# Patient Record
Sex: Male | Born: 1990
Health system: Southern US, Community
[De-identification: ages and names within clinical notes are randomized; demographics above are authoritative.]

## PROBLEM LIST (undated history)

## (undated) DIAGNOSIS — Z9481 Bone marrow transplant status: Secondary | ICD-10-CM

## (undated) DIAGNOSIS — F410 Panic disorder [episodic paroxysmal anxiety] without agoraphobia: Secondary | ICD-10-CM

## (undated) DIAGNOSIS — C4490 Unspecified malignant neoplasm of skin, unspecified: Secondary | ICD-10-CM

## (undated) DIAGNOSIS — H5789 Other specified disorders of eye and adnexa: Secondary | ICD-10-CM

## (undated) DIAGNOSIS — C7490 Malignant neoplasm of unspecified part of unspecified adrenal gland: Secondary | ICD-10-CM

## (undated) DIAGNOSIS — C50919 Malignant neoplasm of unspecified site of unspecified female breast: Secondary | ICD-10-CM

## (undated) HISTORY — PX: ADRENALECTOMY: SHX876

## (undated) HISTORY — PX: MASTECTOMY: SHX3

---
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
## 2010-02-07 ENCOUNTER — Ambulatory Visit: Payer: Self-pay | Admitting: Diagnostic Radiology

## 2010-02-07 ENCOUNTER — Emergency Department (HOSPITAL_BASED_OUTPATIENT_CLINIC_OR_DEPARTMENT_OTHER): Admission: EM | Admit: 2010-02-07 | Discharge: 2010-02-07 | Payer: Self-pay | Admitting: Emergency Medicine

## 2010-02-09 ENCOUNTER — Emergency Department (HOSPITAL_BASED_OUTPATIENT_CLINIC_OR_DEPARTMENT_OTHER): Admission: EM | Admit: 2010-02-09 | Discharge: 2010-02-09 | Payer: Self-pay | Admitting: Emergency Medicine

## 2010-02-09 ENCOUNTER — Ambulatory Visit: Payer: Self-pay | Admitting: Diagnostic Radiology

## 2010-03-30 ENCOUNTER — Emergency Department (HOSPITAL_COMMUNITY): Admission: EM | Admit: 2010-03-30 | Discharge: 2010-03-30 | Payer: Self-pay | Admitting: Emergency Medicine

## 2010-08-10 LAB — COMPREHENSIVE METABOLIC PANEL
ALT: 13 U/L (ref 0–53)
AST: 18 U/L (ref 0–37)
Albumin: 4.5 g/dL (ref 3.5–5.2)
Alkaline Phosphatase: 62 U/L (ref 39–117)
BUN: 13 mg/dL (ref 6–23)
CO2: 29 mEq/L (ref 19–32)
Calcium: 10.1 mg/dL (ref 8.4–10.5)
Chloride: 105 mEq/L (ref 96–112)
Creatinine, Ser: 1 mg/dL (ref 0.4–1.5)
GFR calc Af Amer: >60 mL/min (ref >60)
GFR calc non Af Amer: >60 mL/min (ref >60)
Glucose, Bld: 97 mg/dL (ref 70–99)
Potassium: 3.9 mEq/L (ref 3.5–5.1)
Sodium: 146 mEq/L — ABNORMAL HIGH (ref 135–145)
Total Bilirubin: 1.1 mg/dL (ref 0.3–1.2)
Total Protein: 8.1 g/dL (ref 6.0–8.3)

## 2010-08-10 LAB — CBC
HCT: 39.3 % (ref 39.0–52.0)
Hemoglobin: 13.7 g/dL (ref 13.0–17.0)
MCH: 31.7 pg (ref 26.0–34.0)
MCH: 31.5 pg (ref 26.0–34.0)
MCHC: 34.9 g/dL (ref 30.0–36.0)
MCV: 90.8 fL (ref 78.0–100.0)
MCV: 90.8 fL (ref 78.0–100.0)
Platelets: 202 10*3/uL (ref 150–400)
Platelets: 214 10*3/uL (ref 150–400)
RBC: 4.33 MIL/uL (ref 4.22–5.81)
RBC: 4.52 MIL/uL (ref 4.22–5.81)
RDW: 12.9 % (ref 11.5–15.5)
RDW: 12.4 % (ref 11.5–15.5)
WBC: 5.4 10*3/uL (ref 4.0–10.5)
WBC: 6 10*3/uL (ref 4.0–10.5)

## 2010-08-10 LAB — POCT CARDIAC MARKERS
Myoglobin, poc: 33.2 ng/mL (ref 12–200)
Troponin i, poc: <0.05 ng/mL (ref 0.00–0.09)

## 2010-08-10 LAB — BASIC METABOLIC PANEL
BUN: 9 mg/dL (ref 6–23)
Calcium: 9.8 mg/dL (ref 8.4–10.5)
Chloride: 101 mEq/L (ref 96–112)
Creatinine, Ser: 1 mg/dL (ref 0.4–1.5)
GFR calc Af Amer: >60 mL/min (ref >60)

## 2010-08-10 LAB — DIFFERENTIAL
Basophils Absolute: 0 10*3/uL (ref 0.0–0.1)
Basophils Absolute: 0 10*3/uL (ref 0.0–0.1)
Basophils Relative: 1 % (ref 0–1)
Eosinophils Relative: 1 % (ref 0–5)
Eosinophils Relative: 2 % (ref 0–5)
Lymphocytes Relative: 19 % (ref 12–46)
Lymphs Abs: 1 10*3/uL (ref 0.7–4.0)
Monocytes Absolute: 0.5 10*3/uL (ref 0.1–1.0)
Neutro Abs: 3.9 10*3/uL (ref 1.7–7.7)
Neutrophils Relative %: 71 % (ref 43–77)

## 2010-08-10 LAB — URINALYSIS, ROUTINE W REFLEX MICROSCOPIC
Bilirubin Urine: NEGATIVE
Hgb urine dipstick: NEGATIVE
Ketones, ur: NEGATIVE mg/dL
Nitrite: NEGATIVE
Protein, ur: NEGATIVE mg/dL
Urobilinogen, UA: 1 mg/dL (ref 0.0–1.0)

## 2010-08-10 LAB — POCT TOXICOLOGY PANEL

## 2010-08-10 LAB — TSH: TSH: 1.944 u[IU]/mL (ref 0.350–4.500)

## 2010-08-10 LAB — CORTISOL: Cortisol, Plasma: 20.2 ug/dL

## 2012-09-25 ENCOUNTER — Encounter (HOSPITAL_BASED_OUTPATIENT_CLINIC_OR_DEPARTMENT_OTHER): Payer: Self-pay | Admitting: Emergency Medicine

## 2012-09-25 ENCOUNTER — Emergency Department (HOSPITAL_BASED_OUTPATIENT_CLINIC_OR_DEPARTMENT_OTHER)
Admission: EM | Admit: 2012-09-25 | Discharge: 2012-09-25 | Disposition: A | Discharge status: Home / Self Care | Payer: BC Managed Care – PPO | Attending: Emergency Medicine | Admitting: Emergency Medicine

## 2012-09-25 DIAGNOSIS — R1012 Left upper quadrant pain: Secondary | ICD-10-CM | POA: Insufficient documentation

## 2012-09-25 DIAGNOSIS — R112 Nausea with vomiting, unspecified: Secondary | ICD-10-CM | POA: Insufficient documentation

## 2012-09-25 DIAGNOSIS — Z85858 Personal history of malignant neoplasm of other endocrine glands: Secondary | ICD-10-CM | POA: Insufficient documentation

## 2012-09-25 DIAGNOSIS — R42 Dizziness and giddiness: Secondary | ICD-10-CM | POA: Insufficient documentation

## 2012-09-25 DIAGNOSIS — Z79899 Other long term (current) drug therapy: Secondary | ICD-10-CM | POA: Insufficient documentation

## 2012-09-25 HISTORY — DX: Bone marrow transplant status: Z94.81

## 2012-09-25 HISTORY — DX: Malignant neoplasm of unspecified part of unspecified adrenal gland: C74.90

## 2012-09-25 LAB — COMPREHENSIVE METABOLIC PANEL
AST: 19 U/L (ref 0–37)
Albumin: 4.4 g/dL (ref 3.5–5.2)
CO2: 29 mEq/L (ref 19–32)
Calcium: 9.7 mg/dL (ref 8.4–10.5)
Creatinine, Ser: 1 mg/dL (ref 0.50–1.35)
GFR calc non Af Amer: >90 mL/min (ref >90)
Total Protein: 7.6 g/dL (ref 6.0–8.3)

## 2012-09-25 LAB — CBC WITH DIFFERENTIAL/PLATELET
Eosinophils Absolute: 0 10*3/uL (ref 0.0–0.7)
HCT: 38.5 % — ABNORMAL LOW (ref 39.0–52.0)
Hemoglobin: 13.8 g/dL (ref 13.0–17.0)
Lymphs Abs: 0.7 10*3/uL (ref 0.7–4.0)
MCH: 30.7 pg (ref 26.0–34.0)
Monocytes Relative: 12 % (ref 3–12)
Neutrophils Relative %: 79 % — ABNORMAL HIGH (ref 43–77)
RBC: 4.5 MIL/uL (ref 4.22–5.81)

## 2012-09-25 LAB — LIPASE, BLOOD: Lipase: 13 U/L (ref 11–59)

## 2012-09-25 MED ORDER — SODIUM CHLORIDE 0.9 % IV BOLUS (SEPSIS)
1000.0000 mL | Freq: Once | INTRAVENOUS | Status: AC
  Administered 2012-09-25: 1000 mL via INTRAVENOUS

## 2012-09-25 MED ORDER — ONDANSETRON HCL 4 MG/2ML IJ SOLN
4.0000 mg | Freq: Once | INTRAMUSCULAR | Status: AC
  Administered 2012-09-25: 4 mg via INTRAVENOUS
  Filled 2012-09-25: qty 2

## 2012-09-25 MED ORDER — ONDANSETRON 8 MG PO TBDP: 8.0000 mg | ORAL_TABLET | Freq: Three times a day (TID) | ORAL | Status: DC | PRN

## 2012-09-25 MED ORDER — PANTOPRAZOLE SODIUM 40 MG IV SOLR
40.0000 mg | Freq: Once | INTRAVENOUS | Status: AC
  Administered 2012-09-25: 40 mg via INTRAVENOUS
  Filled 2012-09-25: qty 40

## 2012-09-25 NOTE — ED Notes (Signed)
C/o LUQ burning/aching pain with vomiting since 09/24/12.  No diarrhea or fever.

## 2012-09-25 NOTE — ED Provider Notes (Signed)
History     CSN: [id number]  Arrival date & time 09/25/12  8295   First MD Initiated Contact with Patient 09/25/12 0406      Chief Complaint  Patient presents with  . Vomiting and Abdominal Pain     (Consider location/radiation/quality/duration/timing/severity/associated sxs/prior treatment) HPI This is a 22 year old male who developed nausea, vomiting and left upper quadrant burning abdominal pain yesterday afternoon about 2 PM. The symptoms were moderate and improved towards the afternoon. He awoke about 1 AM with a return of symptoms. They were more intense this time but are now easing off. He denies fever, chills or diarrhea. There is little change in discomfort with movement. He attributes the symptoms to eating some "bad crab" yesterday. He states he is lightheaded when he stands.  Past Medical History  Diagnosis Date  . Neuroblastoma of adrenal gland     Past Surgical History  Procedure Laterality Date  . Adrenalectomy      No family history on file.  History  Substance Use Topics  . Smoking status: Never Smoker   . Smokeless tobacco: Not on file  . Alcohol Use: No      Review of Systems  All other systems reviewed and are negative.    Allergies  Review of patient's allergies indicates no known allergies.  Home Medications   Current Outpatient Rx  Name  Route  Sig  Dispense  Refill  . diphenhydrAMINE (BENADRYL) 25 MG tablet   Oral   Take 25 mg by mouth 2 (two) times daily.           BP 119/77  Pulse 85  Temp(Src) 98.3 F (36.8 C)  Ht 5\' 7"  (1.702 m)  Wt 125 lb (56.7 kg)  BMI 19.57 kg/m2  SpO2 97%  Physical Exam General: Well-developed, well-nourished male in no acute distress; appearance consistent with age of record HENT: normocephalic, atraumatic Eyes: pupils equal round and reactive to light; extraocular muscles intact Neck: supple Heart: regular rate and rhythm Lungs: clear to auscultation bilaterally Abdomen: soft; nondistended;  mild epigastric and right upper quadrant tenderness; no masses or hepatosplenomegaly; bowel sounds present Extremities: No deformity; full range of motion Neurologic: Awake, alert and oriented; motor function intact in all extremities and symmetric; no facial droop Skin: Warm and dry Psychiatric: Normal mood and affect    ED Course  Procedures (including critical care time)     MDM   Nursing notes and vitals signs, including pulse oximetry, reviewed.  Summary of this visit's results, reviewed by myself:  Labs:  Results for orders placed during the hospital encounter of 09/25/12 (from the past 24 hour(s))  LIPASE, BLOOD     Status: None   Collection Time    09/25/12  4:23 AM      Result Value Range   Lipase 13  11 - 59 U/L  COMPREHENSIVE METABOLIC PANEL     Status: Abnormal   Collection Time    09/25/12  4:23 AM      Result Value Range   Sodium 142  135 - 145 mEq/L   Potassium 3.4 (*) 3.5 - 5.1 mEq/L   Chloride 102  96 - 112 mEq/L   CO2 29  19 - 32 mEq/L   Glucose, Bld 115 (*) 70 - 99 mg/dL   BUN 8  6 - 23 mg/dL   Creatinine, Ser 6.21  0.50 - 1.35 mg/dL   Calcium 9.7  8.4 - 30.8 mg/dL   Total Protein 7.6  6.0 - 8.3  g/dL   Albumin 4.4  3.5 - 5.2 g/dL   AST 19  0 - 37 U/L   ALT 13  0 - 53 U/L   Alkaline Phosphatase 56  39 - 117 U/L   Total Bilirubin 0.5  0.3 - 1.2 mg/dL   GFR calc non Af Amer >90  >90 mL/min   GFR calc Af Amer >90  >90 mL/min  CBC WITH DIFFERENTIAL     Status: Abnormal   Collection Time    09/25/12  4:23 AM      Result Value Range   WBC 7.2  4.0 - 10.5 K/uL   RBC 4.50  4.22 - 5.81 MIL/uL   Hemoglobin 13.8  13.0 - 17.0 g/dL   HCT 40.9 (*) 81.1 - 91.4 %   MCV 85.6  78.0 - 100.0 fL   MCH 30.7  26.0 - 34.0 pg   MCHC 35.8  30.0 - 36.0 g/dL   RDW 78.2  95.6 - 21.3 %   Platelets 218  150 - 400 K/uL   Neutrophils Relative 79 (*) 43 - 77 %   Neutro Abs 5.6  1.7 - 7.7 K/uL   Lymphocytes Relative 9 (*) 12 - 46 %   Lymphs Abs 0.7  0.7 - 4.0 K/uL    Monocytes Relative 12  3 - 12 %   Monocytes Absolute 0.8  0.1 - 1.0 K/uL   Eosinophils Relative 0  0 - 5 %   Eosinophils Absolute 0.0  0.0 - 0.7 K/uL   Basophils Relative 0  0 - 1 %   Basophils Absolute 0.0  0.0 - 0.1 K/uL   5:08 AM Feels much improved after 1 L normal saline bolus and Zofran IV.        Hanley Seamen, MD 09/25/12 [phone]

## 2012-09-25 NOTE — ED Notes (Addendum)
No meds taken PTA 

## 2013-12-02 ENCOUNTER — Emergency Department (HOSPITAL_BASED_OUTPATIENT_CLINIC_OR_DEPARTMENT_OTHER)
Admission: EM | Admit: 2013-12-02 | Discharge: 2013-12-02 | Disposition: A | Discharge status: Home / Self Care | Payer: BC Managed Care – PPO | Attending: Emergency Medicine | Admitting: Emergency Medicine

## 2013-12-02 ENCOUNTER — Encounter (HOSPITAL_BASED_OUTPATIENT_CLINIC_OR_DEPARTMENT_OTHER): Payer: Self-pay | Admitting: Emergency Medicine

## 2013-12-02 DIAGNOSIS — N6452 Nipple discharge: Secondary | ICD-10-CM

## 2013-12-02 DIAGNOSIS — N63 Unspecified lump in unspecified breast: Secondary | ICD-10-CM

## 2013-12-02 DIAGNOSIS — Z79899 Other long term (current) drug therapy: Secondary | ICD-10-CM | POA: Insufficient documentation

## 2013-12-02 DIAGNOSIS — Z8589 Personal history of malignant neoplasm of other organs and systems: Secondary | ICD-10-CM | POA: Insufficient documentation

## 2013-12-02 NOTE — ED Notes (Signed)
Also reports increased anxiety from worrying.denies SI/HI

## 2013-12-02 NOTE — ED Provider Notes (Signed)
CSN: [id number]     Arrival date & time 12/02/13  1306 History   First MD Initiated Contact with Patient 12/02/13 1309     Chief Complaint  Patient presents with  . Breast Discharge     (Consider location/radiation/quality/duration/timing/severity/associated sxs/prior Treatment) HPI Comments: Pt states that when he woke up this morning he noticed brown discharge around his right nipple. Pt states that he started to feel the area and he noticed some lumps to the area. Denies previous history of similar problems. No pain to the area. Has a history of neuroblastoma as a child.  The history is provided by the patient. No language interpreter was used.    Past Medical History  Diagnosis Date  . Neuroblastoma of adrenal gland   . Status post autologous bone marrow transplant    Past Surgical History  Procedure Laterality Date  . Adrenalectomy     History reviewed. No pertinent family history. History  Substance Use Topics  . Smoking status: Never Smoker   . Smokeless tobacco: Not on file  . Alcohol Use: No    Review of Systems  Constitutional: Negative.   Respiratory: Negative.   Cardiovascular: Negative.       Allergies  Review of patient's allergies indicates no known allergies.  Home Medications   Prior to Admission medications   Medication Sig Start Date End Date Taking? Authorizing Provider  diphenhydrAMINE (BENADRYL) 25 MG tablet Take 25 mg by mouth 2 (two) times daily.    Historical Provider, MD  ondansetron (ZOFRAN ODT) 8 MG disintegrating tablet Take 1 tablet (8 mg total) by mouth every 8 (eight) hours as needed for nausea. 09/25/12   John L Molpus, MD   BP 134/83  Pulse 70  Temp(Src) 98.6 F (37 C) (Oral)  Resp 18  Ht 5\' 6"  (1.676 m)  Wt 120 lb (54.432 kg)  BMI 19.38 kg/m2  SpO2 100% Physical Exam  Nursing note and vitals reviewed. Constitutional: He is oriented to person, place, and time. He appears well-developed and well-nourished.  Cardiovascular:  Normal rate and regular rhythm.   Pulmonary/Chest: Effort normal and breath sounds normal.    Firm nodules noted. No discharge  Neurological: He is alert and oriented to person, place, and time.  Skin: Skin is warm and dry.    ED Course  Procedures (including critical care time) Labs Review Labs Reviewed - No data to display  Imaging Review No results found.   EKG Interpretation None      MDM   Final diagnoses:  Breast mass    Pt to be seen at the breast center    Glendell Docker, NP 12/02/13 1344

## 2013-12-02 NOTE — Discharge Instructions (Signed)
Breast Cyst A breast cyst is a sac in the breast that is filled with fluid. Breast cysts are common in women. Women can have one or many cysts. When the breasts contain many cysts, it is usually due to a noncancerous (benign) condition called fibrocystic change. These lumps form under the influence of male hormones (estrogen and progesterone). The lumps are most often located in the upper, outer portion of the breast. They are often more swollen, painful, and tender before your period starts. They usually disappear after menopause, unless you are on hormone therapy.  There are several types of cysts:  Macrocyst. This is a cyst that is about 2 in. (5.1 cm) in diameter.   Microcyst. This is a tiny cyst that you cannot feel but can be seen with a mammogram or an ultrasound.   Galactocele. This is a cyst containing milk that may develop if you suddenly stop breastfeeding.   Sebaceous cyst of the skin. This type of cyst is not in the breast tissue itself. Breast cysts do not increase your risk of breast cancer. However, they must be monitored closely because they can be cancerous.  CAUSES  It is not known exactly what causes a breast cyst to form. Possible causes include:  An overgrowth of milk glands and connective tissue in the breast can block the milk glands, causing them to fill with fluid.   Scar tissue in the breast from previous surgery may block the glands, causing a cyst.  RISK FACTORS Estrogen may influence the development of a breast cyst.  SIGNS AND SYMPTOMS   Feeling a smooth, round, soft lump (like a grape) in the breast that is easily moveable.   Breast discomfort or pain.  Increase in size of the lump before your menstrual period and decrease in its size after your menstrual period.  DIAGNOSIS  A cyst can be felt during a physical exam by your health care provider. A breast X-ray exam (mammogram) and ultrasonography will be done to confirm the diagnosis. Fluid may  be removed from the cyst with a needle (fine needle aspiration) to make sure the cyst is not cancerous.  TREATMENT  Treatment may not be necessary. Your health care provider may monitor the cyst to see if it goes away on its own. If treatment is needed, it may include:  Hormone treatment.   Needle aspiration. There is a chance of the cyst coming back after aspiration.   Surgery to remove the whole cyst.  HOME CARE INSTRUCTIONS   Keep all follow-up appointments with your health care provider.  See your health care provider regularly:  Get a yearly exam by your health care provider.  Have a clinical breast exam by a health care provider every 1-3 years if you are 20-40 years of age. After age 40 years, you should have the exam every year.   Get mammogram tests as directed by your health care provider.   Understand the normal appearance and feel of your breasts and perform breast self-exams.   Only take over-the-counter or prescription medicines as directed by your health care provider.   Wear a supportive bra, especially when exercising.   Avoid caffeine.   Reduce your salt intake, especially before your menstrual period. Too much salt can cause fluid retention, breast swelling, and discomfort.  SEEK MEDICAL CARE IF:   You feel, or think you feel, a lump in your breast.   You notice that both breasts look or feel different than usual.   Your   breast is still causing pain after your menstrual period is over.   You need medicine for breast pain and swelling that occurs with your menstrual period.  SEEK IMMEDIATE MEDICAL CARE IF:   You have severe pain, tenderness, redness, or warmth in your breast.   You have nipple discharge or bleeding.   Your breast lump becomes hard and painful.   You find new lumps or bumps that were not there before.   You feel lumps in your armpit (axilla).   You notice dimpling or wrinkling of the breast or nipple.   You  have a fever.  MAKE SURE YOU:  Understand these instructions.  Will watch your condition.  Will get help right away if you are not doing well or get worse. Document Released: 05/14/2005 Document Revised: 01/14/2013 Document Reviewed: 12/11/2012 ExitCare Patient Information 2015 [employer]. This information is not intended to replace advice given to you by your health care provider. Make sure you discuss any questions you have with your health care provider.  

## 2013-12-02 NOTE — ED Notes (Signed)
Appointment scheduled at [place] on [facility] 12/02/13 @ 130pm.  Detailed instructions placed with order in chart by Glendell Docker.

## 2013-12-02 NOTE — ED Provider Notes (Signed)
Medical screening examination/treatment/procedure(s) were performed by non-physician practitioner and as supervising physician I was immediately available for consultation/collaboration.   EKG Interpretation None        Delice Bison Tecla Mailloux, DO 12/02/13 1351

## 2013-12-02 NOTE — ED Notes (Signed)
Brown discharge from right nipple this am. Denies other symptoms.

## 2013-12-03 ENCOUNTER — Other Ambulatory Visit (HOSPITAL_BASED_OUTPATIENT_CLINIC_OR_DEPARTMENT_OTHER): Payer: Self-pay | Admitting: Nurse Practitioner

## 2013-12-03 ENCOUNTER — Ambulatory Visit
Admission: RE | Admit: 2013-12-03 | Discharge: 2013-12-03 | Disposition: A | Discharge status: Home / Self Care | Payer: Self-pay | Source: Ambulatory Visit | Attending: Nurse Practitioner | Admitting: Nurse Practitioner

## 2013-12-03 DIAGNOSIS — N6452 Nipple discharge: Secondary | ICD-10-CM

## 2013-12-10 ENCOUNTER — Ambulatory Visit
Admission: RE | Admit: 2013-12-10 | Discharge: 2013-12-10 | Disposition: A | Discharge status: Home / Self Care | Payer: No Typology Code available for payment source | Source: Ambulatory Visit | Attending: Nurse Practitioner | Admitting: Nurse Practitioner

## 2013-12-10 DIAGNOSIS — N6452 Nipple discharge: Secondary | ICD-10-CM

## 2013-12-23 ENCOUNTER — Other Ambulatory Visit: Payer: Self-pay | Admitting: Oncology

## 2013-12-24 ENCOUNTER — Other Ambulatory Visit: Payer: Self-pay | Admitting: *Deleted

## 2013-12-24 ENCOUNTER — Encounter (INDEPENDENT_AMBULATORY_CARE_PROVIDER_SITE_OTHER): Payer: Self-pay | Admitting: General Surgery

## 2013-12-24 ENCOUNTER — Ambulatory Visit (INDEPENDENT_AMBULATORY_CARE_PROVIDER_SITE_OTHER): Payer: Self-pay | Admitting: General Surgery

## 2013-12-24 ENCOUNTER — Encounter: Payer: Self-pay | Admitting: *Deleted

## 2013-12-24 ENCOUNTER — Other Ambulatory Visit (INDEPENDENT_AMBULATORY_CARE_PROVIDER_SITE_OTHER): Payer: Self-pay

## 2013-12-24 VITALS — BP 120/76 | HR 74 | Temp 98.0°F | Resp 18 | Ht 66.0 in | Wt 115.0 lb

## 2013-12-24 DIAGNOSIS — D0591 Unspecified type of carcinoma in situ of right breast: Secondary | ICD-10-CM

## 2013-12-24 DIAGNOSIS — C50929 Malignant neoplasm of unspecified site of unspecified male breast: Secondary | ICD-10-CM

## 2013-12-24 DIAGNOSIS — C50521 Malignant neoplasm of lower-outer quadrant of right male breast: Secondary | ICD-10-CM | POA: Insufficient documentation

## 2013-12-24 NOTE — Patient Instructions (Signed)
You have been diagnosed with breast cancer in the right breast. There is no evidence of cancer elsewhere on physical exam or on the mammograms.  This may have been caused by the whole body radiation. It also might be due to a genetic mutation. It also could have occurred randomly.  Eventually you will need to have an operation to remove the cancer from your right breast and sample the lymph nodes under your right arm. That operation is called a right mastectomy and right axillary sentinel node biopsy.  As we discussed, you'll be referred immediately to a breast medical oncologist to discuss other forms of treatment that you need and any more tests you might need.  You will also be discussed referred to a genetic counselor for genetic testing.  This will help to assess your risk for developing a second breast cancer in the future.  Return to see Dr. Dalbert Batman in 2-3 weeks for final decision making.

## 2013-12-24 NOTE — Progress Notes (Signed)
Patient ID: Philip Marquez, male   DOB: 04/03/1991, 23 y.o.   MRN: [id number]  Chief Complaint  Patient presents with  . New Evaluation    rt breast    HPI Philip Marquez is a 23 y.o. male.  He is referred by Dr. Jake Shark at the breast center of [facility] for evaluation and management of a newly diagnosed invasive carcinoma of the right breast.  This young man is here with his mother and father. Past history is significant. He was diagnosed with a right adrenal neuroblastoma at age 28-1/2. He had neoadjuvant chemotherapy, then right adrenalectomy, then full-body radiation therapy and chemotherapy and bone marrow transplant. Most of this was supervised at [facility]. He was called for cervical years but is not being actively followed now. He has no other health problems.  1 month ago he noticed drainage from his right nipple but it felt a little bumpy and went for evaluation. No pain. Mammogram and ultrasound show a 3.1 x 2.7 x 0.6 eccentric masslike area in the right breast, 7:00 position, 2 cm from the nipple. He also reported bilateral gynecomastia. Image guided biopsy shows invasive ductal carcinoma with papillary features, DCIS.      ER-positive 100%,    PR-positive 40%,    Ki-67 41%,    HER-2-negative.    Family history reveals breast cancer in a maternal second cousin. No ovarian cancer. A great uncle had neuroblastoma.  He is otherwise healthy. Takes no medications and has no active health problems. He is working at [employer]. He is also a Building control surveyor at [employer]. HPI  Past Medical History  Diagnosis Date  . Neuroblastoma of adrenal gland   . Status post autologous bone marrow transplant     Past Surgical History  Procedure Laterality Date  . Adrenalectomy      No family history on file.  Social History History  Substance Use Topics  . Smoking status: Never Smoker   . Smokeless tobacco: Not on file  . Alcohol Use: No    No Known Allergies  Current  Outpatient Prescriptions  Medication Sig Dispense Refill  . diphenhydrAMINE (BENADRYL) 25 MG tablet Take 25 mg by mouth 2 (two) times daily.      . ondansetron (ZOFRAN ODT) 8 MG disintegrating tablet Take 1 tablet (8 mg total) by mouth every 8 (eight) hours as needed for nausea.  10 tablet  0   No current facility-administered medications for this visit.    Review of Systems Review of Systems  Constitutional: Negative for fever, chills and unexpected weight change.  HENT: Negative for congestion, hearing loss, sore throat, trouble swallowing and voice change.   Eyes: Negative for visual disturbance.  Respiratory: Negative for cough and wheezing.   Cardiovascular: Negative for chest pain, palpitations and leg swelling.  Gastrointestinal: Negative for nausea, vomiting, abdominal pain, diarrhea, constipation, blood in stool, abdominal distention, anal bleeding and rectal pain.  Genitourinary: Negative for hematuria and difficulty urinating.  Musculoskeletal: Negative for arthralgias.  Skin: Negative for rash and wound.  Neurological: Negative for seizures, syncope, weakness and headaches.  Hematological: Negative for adenopathy. Does not bruise/bleed easily.  Psychiatric/Behavioral: Negative for confusion.    Blood pressure 120/76, pulse 74, temperature 98 F (36.7 C), resp. rate 18, height 5' 6"  (1.676 m), weight 115 lb (52.164 kg).  Physical Exam Physical Exam  Constitutional: He is oriented to person, place, and time. He appears well-developed and well-nourished. No distress.  HENT:  Head: Normocephalic.  Nose: Nose normal.  Mouth/Throat: No oropharyngeal exudate.  Eyes: Conjunctivae and EOM are normal. Pupils are equal, round, and reactive to light. Right eye exhibits no discharge. Left eye exhibits no discharge. No scleral icterus.  Neck: Normal range of motion. Neck supple. No JVD present. No tracheal deviation present. No thyromegaly present.  Cardiovascular: Normal rate,  regular rhythm, normal heart sounds and intact distal pulses.   No murmur heard. Pulmonary/Chest: Effort normal and breath sounds normal. No stridor. No respiratory distress. He has no wheezes. He has no rales. He exhibits no tenderness.  Bilateral mild  gynecomastia. Healthy-appearing nipple and areolar skin bilaterally. I cannot elicit a discharge. He does feel a little bit bumpy in the right periareolar area, lower outer. No axillary adenopathy. No supraclavicular adenopathy.  Abdominal: Soft. Bowel sounds are normal. He exhibits no distension and no mass. There is no tenderness. There is no rebound and no guarding.  Transverse right sided upper abdominal incision well healed. No hernia. No abdominal mass or tenderness. No inguinal adenopathy.  Musculoskeletal: Normal range of motion. He exhibits no edema and no tenderness.  Lymphadenopathy:    He has no cervical adenopathy.  Neurological: He is alert and oriented to person, place, and time. He has normal reflexes. Coordination normal.  Skin: Skin is warm and dry. No rash noted. He is not diaphoretic. No erythema. No pallor.  Psychiatric: He has a normal mood and affect. His behavior is normal. Judgment and thought content normal.    Data Reviewed Imaging studies. Histology. Breast diagnostic profile.  Assessment    Invasive ductal carcinoma with papillary features and DCIS right breast, lower outer quadrant in male. Receptor positive. HER-2 negative. Clinical stage T2, N0  History right adrenalectomy for neuroblastoma age 63 (N-myc gene associated, but no available genetic testing)  History full-body radiation therapy  History bone marrow transplantation     Plan    I spent  the better part of an hour discussing this with the patient and his mother and father. I told him that his care would be multidisciplinary. I told him that surgical approach for the cancer the right breast would be a right total mastectomy and right sentinel node  biopsy. I described the procedure in detail.  I told them that I was unsure whether his cancer was due to a genetic mutation, whole body radiation therapy, or simply a random event. I advised genetic counseling and testing and they agree. He is referred for genetic counseling.  They are aware that if he has a genetic mutation that would greatly increase his risk of contralateral cancer in the future. I told them that consideration could be given for prophylactic left mastectomy if that was the case, and I clearly told this was optional and not mandatory.  I advised him to seek medical oncology consultation  prior to finalizing treatment plan. They are in favor of that. The mother and father will bring all of the records from [facility] that they have, with special attention to the chemotherapy and radiation treatment he had at age 61 for his neuroblastoma. I told him that decisions regarding chemotherapy and antiestrogen therapy would be made by Dr. Jana Hakim, probably after surgical pathologic staging is complete.  Return to see me in 2-3 weeks. Hopefully we can finalize his surgical treatment plan at that time.        Edsel Petrin. Dalbert Batman, M.D., [facility] General and Minimally invasive Surgery Breast and Colorectal Surgery Office:  [phone]   12/24/2013, 12:22 PM

## 2013-12-24 NOTE — Progress Notes (Signed)
Philip Marquez, Dr. Darrel Hoover nurse genetics and new pt appt with Dr.Magrinat for 8/3 for genetics and 8/7 with Dr. Jana Hakim. Request Glenda to have pt parents call me directly for further information. Gave contact information. Will mail New pt letter and intake form.

## 2013-12-25 ENCOUNTER — Telehealth: Payer: Self-pay | Admitting: *Deleted

## 2013-12-25 NOTE — Telephone Encounter (Signed)
Left vm for mother to return call to give detailed information on genetics and med onc appt. Contact information given.

## 2013-12-28 ENCOUNTER — Ambulatory Visit (HOSPITAL_BASED_OUTPATIENT_CLINIC_OR_DEPARTMENT_OTHER): Payer: Self-pay | Admitting: Genetic Counselor

## 2013-12-28 ENCOUNTER — Other Ambulatory Visit (HOSPITAL_BASED_OUTPATIENT_CLINIC_OR_DEPARTMENT_OTHER): Payer: Self-pay

## 2013-12-28 DIAGNOSIS — C50929 Malignant neoplasm of unspecified site of unspecified male breast: Secondary | ICD-10-CM

## 2013-12-28 DIAGNOSIS — C50911 Malignant neoplasm of unspecified site of right female breast: Secondary | ICD-10-CM | POA: Insufficient documentation

## 2013-12-28 DIAGNOSIS — Z800 Family history of malignant neoplasm of digestive organs: Secondary | ICD-10-CM

## 2013-12-28 DIAGNOSIS — Z803 Family history of malignant neoplasm of breast: Secondary | ICD-10-CM

## 2013-12-28 DIAGNOSIS — C7490 Malignant neoplasm of unspecified part of unspecified adrenal gland: Secondary | ICD-10-CM

## 2013-12-28 LAB — CBC WITH DIFFERENTIAL/PLATELET
BASO%: 0.5 % (ref 0.0–2.0)
Basophils Absolute: 0 10*3/uL (ref 0.0–0.1)
EOS%: 0.7 % (ref 0.0–7.0)
Eosinophils Absolute: 0 10*3/uL (ref 0.0–0.5)
HEMATOCRIT: 40.3 % (ref 38.4–49.9)
HGB: 13.5 g/dL (ref 13.0–17.1)
LYMPH#: 1.6 10*3/uL (ref 0.9–3.3)
LYMPH%: 36.9 % (ref 14.0–49.0)
MCH: 30.4 pg (ref 27.2–33.4)
MCHC: 33.6 g/dL (ref 32.0–36.0)
MCV: 90.6 fL (ref 79.3–98.0)
MONO#: 0.4 10*3/uL (ref 0.1–0.9)
MONO%: 9.2 % (ref 0.0–14.0)
NEUT#: 2.3 10*3/uL (ref 1.5–6.5)
NEUT%: 52.7 % (ref 39.0–75.0)
Platelets: 213 10*3/uL (ref 140–400)
RBC: 4.45 10*6/uL (ref 4.20–5.82)
RDW: 13.3 % (ref 11.0–14.6)
WBC: 4.4 10*3/uL (ref 4.0–10.3)

## 2013-12-28 LAB — COMPREHENSIVE METABOLIC PANEL (CC13)
ALBUMIN: 4.4 g/dL (ref 3.5–5.0)
ALT: 12 U/L (ref 0–55)
ANION GAP: 8 meq/L (ref 3–11)
AST: 19 U/L (ref 5–34)
Alkaline Phosphatase: 47 U/L (ref 40–150)
BUN: 10.6 mg/dL (ref 7.0–26.0)
CALCIUM: 9.7 mg/dL (ref 8.4–10.4)
CHLORIDE: 102 meq/L (ref 98–109)
CO2: 32 meq/L — AB (ref 22–29)
Creatinine: 1 mg/dL (ref 0.7–1.3)
Glucose: 106 mg/dl (ref 70–140)
POTASSIUM: 4.1 meq/L (ref 3.5–5.1)
SODIUM: 142 meq/L (ref 136–145)
TOTAL PROTEIN: 7.8 g/dL (ref 6.4–8.3)
Total Bilirubin: 0.77 mg/dL (ref 0.20–1.20)

## 2013-12-28 NOTE — Progress Notes (Signed)
HISTORY OF PRESENT ILLNESS: Philip Marquez, a 23 y.o. male, was seen in the [facility] due to a personal and family history of cancer.  Philip Marquez presents to clinic today to discuss the possibility of a hereditary predisposition to cancer, genetic testing, and to further clarify his future cancer risks, as well as potential cancer risk for family members. He is accompanied by his mother, Butch Penny, and father, Patrick Jupiter.  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 He had neoadjuvant chemotherapy, then right adrenalectomy, then full-body radiation therapy and chemotherapy and bone marrow transplant. Most of this was supervised at [facility].  One month ago he noticed drainage from his right nipple but it felt a little bumpy and went for evaluation. No pain. Mammogram and ultrasound show a 3.1 x 2.7 x 0.6 eccentric masslike area in the right breast, 7:00 position, 2 cm from the nipple. He also reported bilateral gynecomastia. Image guided biopsy shows invasive ductal carcinoma with papillary features, DCIS. ER-positive 100%, PR-positive 40%, Ki-67 41%, HER-2-negative.   He is otherwise healthy. Takes no medications and has no active health problems.    Past Medical History  Diagnosis Date   Neuroblastoma of adrenal gland    Status post autologous bone marrow transplant     Past Surgical History  Procedure Laterality Date   Adrenalectomy     History   Social History   Marital Status: Single    Spouse Name: N/A    Number of Children: N/A   Years of Education: N/A   Social History Main Topics   Smoking status: Never Smoker    Smokeless tobacco: Not on file   Alcohol Use: No   Drug Use: No   Sexual Activity: Not on file   Other Topics Concern   Not on file   Social History Narrative   No narrative on file     FAMILY HISTORY:  During the visit, a 4-generation pedigree was obtained. Significant diagnoses include the  following:  Family History  Problem Relation Age of Onset   Cancer Maternal Grandfather 9    colorectal   Cancer Other 25    mat great uncle (through [facility]) with possible neuroblastoma   Cancer Other 101    male mat first cousin once removed with breast cancer (related through MGF's sister)    Philip Marquez ancestry is of African American descent. There is no known Jewish ancestry or consanguinity.  GENETIC COUNSELING ASSESSMENT: Philip Marquez is a 23 y.o. male with a personal and family history of cancer suggestive of a hereditary predisposition to cancer. We, therefore, discussed and recommended the following at today's visit.   DISCUSSION: We reviewed the characteristics, features and inheritance patterns of hereditary cancer syndromes. We also discussed genetic testing, including the appropriate family members to test, the process of testing, insurance coverage and turn-around-time for results. We discussed the implications of a negative, positive and/or variant of uncertain significant result. We recommended Philip Marquez pursue genetic testing for the BRCA1, BRCA2 and TP53 genes.   PLAN: Based on our above recommendation, Philip Marquez wished to pursue genetic testing and the blood sample was drawn and will be sent to [facility] for analysis. Results should be available within approximately 4 weeks time, at which point they will be disclosed by telephone to Philip Marquez, as will any additional recommendations warranted by these results.   We also encouraged Philip Marquez to remain in contact with cancer genetics annually  so that we can continuously update the family history and inform him of any changes in cancer genetics and testing that may be of benefit for this family. Mr.  Marquez questions were answered to his satisfaction today. Our contact information was provided should additional questions or concerns arise.   Thank you for the referral and allowing [place] to share in the care  of your patient.   The patient was seen for a total of 45 minutes in face-to-face genetic counseling.  This patient was discussed with Dr. Jana Hakim who agrees with the above.    _______________________________________________________________________ For Office Staff:  Number of people involved in session: 4 Was an Intern/ student involved with case: not applicable

## 2014-01-01 ENCOUNTER — Ambulatory Visit: Payer: Self-pay

## 2014-01-01 ENCOUNTER — Ambulatory Visit (HOSPITAL_BASED_OUTPATIENT_CLINIC_OR_DEPARTMENT_OTHER): Payer: Self-pay | Admitting: Oncology

## 2014-01-01 VITALS — BP 128/64 | HR 68 | Temp 98.0°F | Resp 18 | Ht 66.0 in | Wt 117.5 lb

## 2014-01-01 DIAGNOSIS — C50929 Malignant neoplasm of unspecified site of unspecified male breast: Secondary | ICD-10-CM

## 2014-01-01 DIAGNOSIS — C50521 Malignant neoplasm of lower-outer quadrant of right male breast: Secondary | ICD-10-CM

## 2014-01-01 DIAGNOSIS — Z170 Estrogen receptor positive status [ER+]: Secondary | ICD-10-CM

## 2014-01-01 NOTE — Progress Notes (Signed)
[place]  Telephone:([phone]) [phone] Fax:(336) [phone]     ID: Maryruth Eve DOB: [birth year]  MR#: [id number]  GYI#:[id number]  Patient Care Team: No Pcp Per Patient as PCP - General (General Practice) Adin Hector, MD as Consulting Physician (General Surgery) Chauncey Cruel, MD as Consulting Physician (Oncology)   CHIEF COMPLAINT: Estrogen receptor positive breast cancer  CURRENT TREATMENT: Awaiting definitive therapy   BREAST CANCER HISTORY: "Philip Marquez' was diagnosed with metastatic neuroblastoma at the age of 23, and treated at [facility] with cisplatin, VP-16, doxorubicin) to a total dose of outer and 50 mg per meter squared), cyclophosphamide, and cis retinoic acid. He underwent preparatory treatment with melphalan carboplatin and etoposide followed by autologous transplant. He had a right adrenal gland and regional lymph nodes removed March 1995 and received radiation therapy to the right adrenal area (a total of 1000 cGy) and to the total body (1000 cGy) completed June of 1995.   More recently, while showering she noted a brownish fluid coming out of his right nipple. Then he palpated a mass in the right breast. The patient underwent bilateral diagnostic mammography and right breast ultrasonography of the breast Center 12/03/2013. This showed a breast density to be category A. There was bilateral subareolar gynecomastia. In the right breast there was a 2 cm lobular mass which was palpable on physical exam. Ultrasound showed a 3.1 cm lobular mass with mixed echogenicity. There was no right axillary lymphadenopathy.  On 12/10/2013 the patient underwent right lower outer quadrant needle core biopsy with the pathology ([id number]) showing invasive ductal carcinoma with papillary features in the setting of ductal carcinoma in situ. The tumor was estrogen receptor 100% positive with strong staining intensity, progesterone receptor 40% positive with moderate staining intensity, with  an MIB-1 of 41%, and no HER-2 amplification, the signals ratio being 1.13 and the copy number per cell 1.55. The patient's case was presented at the multidisciplinary breast cancer conference 12/23/2013, were mastectomy with sentinel lymph node was recommended. The patient was also scheduled for genetics appointment, and a blood sample for BRCA 1 and 2 and TB 53 was sent 12/28/2013  His subsequent history is as detailed below  INTERVAL HISTORY: Philip Marquez was evaluated in the breast clinic 01/01/2014 accompanied by his father Patrick Jupiter and his mother Butch Penny  REVIEW OF SYSTEMS: Aside from the right breast mass itself, there were no specific symptoms leading to the diagnostic mammogram. The patient denies unusual headaches, visual changes, nausea, vomiting, stiff neck, dizziness, or gait imbalance. There has been no cough, phlegm production, or pleurisy, no chest pain or pressure, and no change in bowel or bladder habits. The patient denies fever, rash, bleeding, unexplained fatigue or unexplained weight loss. A detailed review of systems was otherwise entirely negative.  PAST MEDICAL HISTORY: Past Medical History  Diagnosis Date  . Neuroblastoma of adrenal gland   . Status post autologous bone marrow transplant     PAST SURGICAL HISTORY: Past Surgical History  Procedure Laterality Date  . Adrenalectomy      FAMILY HISTORY Family History  Problem Relation Age of Onset  . Cancer Maternal Grandfather 26    colorectal  . Cancer Other 25    mat great uncle (through [facility]) with possible neuroblastoma  . Cancer Other 45    male mat first cousin once removed with breast cancer (related through MGF's sister)   the patient's father is 49 years old and his mother 4 years old as of August 2015. The patient has  2 brothers, no sisters. Cancers on the patient's mother's side include a grandmother with brain cancer, a cousin of the patient's mother with male breast cancer diagnosed in her 29s, a grandfather  with colon cancer, and a maternal uncle with lymph node cancer. There is no history of ovarian cancer in the family.  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 He also likes drawing and music particularly rap. He lives at home with his parents and his younger brother, who is 35 years old. He is not a church attender at present    [place]: History  Substance Use Topics  . Smoking status: Never Smoker   . Smokeless tobacco: Not on file  . Alcohol Use: No     Colonoscopy:  PSA:  Bone density:  Lipid panel:  TSH:  No Known Allergies  Current Outpatient Prescriptions  Medication Sig Dispense Refill  . diphenhydrAMINE (BENADRYL) 25 MG tablet Take 25 mg by mouth 2 (two) times daily.      . ondansetron (ZOFRAN ODT) 8 MG disintegrating tablet Take 1 tablet (8 mg total) by mouth every 8 (eight) hours as needed for nausea.  10 tablet  0   No current facility-administered medications for this visit.    OBJECTIVE: Young African American male in no acute distress Filed Vitals:   01/01/14 1648  BP: 128/64  Pulse: 68  Temp: 98 F (36.7 C)  Resp: 18     Body mass index is 18.97 kg/(m^2).    ECOG FS:0 - Asymptomatic  Ocular: Sclerae unicteric, pupils equal, round and reactive to light Ear-nose-throat: Oropharynx clear and moist Lymphatic: No cervical or supraclavicular adenopathy Lungs no rales or rhonchi, good excursion bilaterally Heart regular rate and rhythm, no murmur appreciated Abd soft, nontender, positive bowel sounds; right sided scar noted MSK no focal spinal tenderness, no joint edema Neuro: non-focal, well-oriented, positive affect Breasts: In the right breast there is a mass measuring approximately 2-1/2 cm by palpation, N0 laterally to the areola. There is no drainage from the right nipple. The right axilla is benign. The left breast is unremarkable   LAB RESULTS:  CMP     Component Value Date/Time    NA 142 12/28/2013 1505   NA 142 09/25/2012 0423   K 4.1 12/28/2013 1505   K 3.4* 09/25/2012 0423   CL 102 09/25/2012 0423   CO2 32* 12/28/2013 1505   CO2 29 09/25/2012 0423   GLUCOSE 106 12/28/2013 1505   GLUCOSE 115* 09/25/2012 0423   BUN 10.6 12/28/2013 1505   BUN 8 09/25/2012 0423   CREATININE 1.0 12/28/2013 1505   CREATININE 1.00 09/25/2012 0423   CALCIUM 9.7 12/28/2013 1505   CALCIUM 9.7 09/25/2012 0423   PROT 7.8 12/28/2013 1505   PROT 7.6 09/25/2012 0423   ALBUMIN 4.4 12/28/2013 1505   ALBUMIN 4.4 09/25/2012 0423   AST 19 12/28/2013 1505   AST 19 09/25/2012 0423   ALT 12 12/28/2013 1505   ALT 13 09/25/2012 0423   ALKPHOS 47 12/28/2013 1505   ALKPHOS 56 09/25/2012 0423   BILITOT 0.77 12/28/2013 1505   BILITOT 0.5 09/25/2012 0423   GFRNONAA >90 09/25/2012 0423   GFRAA >90 09/25/2012 0423    I No results found for this basename: SPEP,  UPEP,   kappa and lambda light chains    Lab Results  Component Value Date   WBC 4.4 12/28/2013   NEUTROABS 2.3 12/28/2013   HGB 13.5  12/28/2013   HCT 40.3 12/28/2013   MCV 90.6 12/28/2013   PLT 213 12/28/2013      Chemistry      Component Value Date/Time   NA 142 12/28/2013 1505   NA 142 09/25/2012 0423   K 4.1 12/28/2013 1505   K 3.4* 09/25/2012 0423   CL 102 09/25/2012 0423   CO2 32* 12/28/2013 1505   CO2 29 09/25/2012 0423   BUN 10.6 12/28/2013 1505   BUN 8 09/25/2012 0423   CREATININE 1.0 12/28/2013 1505   CREATININE 1.00 09/25/2012 0423      Component Value Date/Time   CALCIUM 9.7 12/28/2013 1505   CALCIUM 9.7 09/25/2012 0423   ALKPHOS 47 12/28/2013 1505   ALKPHOS 56 09/25/2012 0423   AST 19 12/28/2013 1505   AST 19 09/25/2012 0423   ALT 12 12/28/2013 1505   ALT 13 09/25/2012 0423   BILITOT 0.77 12/28/2013 1505   BILITOT 0.5 09/25/2012 0423       No results found for this basename: LABCA2    No components found with this basename: LABCA125    No results found for this basename: INR,  in the last 168 hours  Urinalysis    Component Value Date/Time   COLORURINE YELLOW 02/07/2010 [place] 02/07/2010 0509   LABSPEC 1.009 02/07/2010 0509   PHURINE 7.0 02/07/2010 0509   GLUCOSEU NEGATIVE 02/07/2010 0509   HGBUR NEGATIVE 02/07/2010 0509   BILIRUBINUR NEGATIVE 02/07/2010 0509   KETONESUR NEGATIVE 02/07/2010 0509   PROTEINUR NEGATIVE 02/07/2010 0509   UROBILINOGEN 1.0 02/07/2010 0509   NITRITE NEGATIVE 02/07/2010 0509   LEUKOCYTESUR NEGATIVE MICROSCOPIC NOT DONE ON URINES WITH NEGATIVE PROTEIN, BLOOD, LEUKOCYTES, NITRITE, OR GLUCOSE <1000 mg/dL. 02/07/2010 0509    STUDIES: Mm Digital Diagnostic Bilat  12/03/2013   CLINICAL DATA:  Patient presents for evaluation of palpable mass within the right breast as well as new onset brownish nipple discharge.  EXAM: DIGITAL DIAGNOSTIC  BILATERAL MAMMOGRAM WITH CAD  ULTRASOUND RIGHT BREAST  COMPARISON:  None.  ACR Breast Density Category a: The breast tissue is almost entirely fatty.  FINDINGS: There is bilateral subareolar gynecomastia. Additionally underlying the palpable marker within the lower outer right breast is a 2 cm lobular mass.  Mammographic images were processed with CAD.  On physical exam, I palpate a firm mass within the lower outer right breast eccentric from the nipple.  Ultrasound is performed, showing a 3.1 x 2.7 x 0.6 cm lobular mixed echogenicity mass with suggestion of internal hypoechoic tubules within the right breast 7 o'clock position 2 cm from the nipple. No right axillary lymphadenopathy.  IMPRESSION: Suspicious palpable mixed echogenicity mass eccentric to the nipple within the right breast.  RECOMMENDATION: Ultrasound-guided core needle biopsy suspicious right breast mass.  Procedure scheduled for 12/10/2013 at 3 p.m.  I have discussed the findings and recommendations with the patient. Results were also provided in writing at the conclusion of the visit. If applicable, a reminder letter will be sent to the patient regarding the next appointment.  BI-RADS CATEGORY  4: Suspicious.   Electronically Signed   By: Lovey Newcomer M.D.    On: 12/03/2013 16:53   US Breast Ltd Uni Right Inc Axilla  12/03/2013   CLINICAL DATA:  Patient presents for evaluation of palpable mass within the right breast as well as new onset brownish nipple discharge.  EXAM: DIGITAL DIAGNOSTIC  BILATERAL MAMMOGRAM WITH CAD  ULTRASOUND RIGHT BREAST  COMPARISON:  None.  ACR Breast  Density Category a: The breast tissue is almost entirely fatty.  FINDINGS: There is bilateral subareolar gynecomastia. Additionally underlying the palpable marker within the lower outer right breast is a 2 cm lobular mass.  Mammographic images were processed with CAD.  On physical exam, I palpate a firm mass within the lower outer right breast eccentric from the nipple.  Ultrasound is performed, showing a 3.1 x 2.7 x 0.6 cm lobular mixed echogenicity mass with suggestion of internal hypoechoic tubules within the right breast 7 o'clock position 2 cm from the nipple. No right axillary lymphadenopathy.  IMPRESSION: Suspicious palpable mixed echogenicity mass eccentric to the nipple within the right breast.  RECOMMENDATION: Ultrasound-guided core needle biopsy suspicious right breast mass.  Procedure scheduled for 12/10/2013 at 3 p.m.  I have discussed the findings and recommendations with the patient. Results were also provided in writing at the conclusion of the visit. If applicable, a reminder letter will be sent to the patient regarding the next appointment.  BI-RADS CATEGORY  4: Suspicious.   Electronically Signed   By: Lovey Newcomer M.D.   On: 12/03/2013 16:53   [place] Rt Breast Bx W Loc Dev 1st Lesion Img Bx Spec US Guide  12/14/2013   ADDENDUM REPORT: 12/14/2013 15:17  ADDENDUM: The pathology associated with the right breast ultrasound-guided core biopsy demonstrated invasive mammary carcinoma with papillary features and DCIS. The pathology is concordant with the imaging findings. I have discussed the findings with the patient by telephone and answered his questions. Post biopsy wound care  instructions were reviewed with the patient. The patient will be scheduled for surgical consultation. This will be arranged by Susa Raring, RN. The patient was encouraged to call our breast center for additional questions or concerns.   Electronically Signed   By: Luberta Robertson M.D.   On: 12/14/2013 15:17   12/14/2013   CLINICAL DATA:  The patient returns for ultrasound-guided core biopsy of mass in the 7 o'clock location of the right breast.  EXAM: ULTRASOUND GUIDED RIGHT BREAST CORE NEEDLE BIOPSY  COMPARISON:  Previous exams.  PROCEDURE: I met with the patient and we discussed the procedure of ultrasound-guided biopsy, including benefits and alternatives. We discussed the high likelihood of a successful procedure. We discussed the risks of the procedure including infection, bleeding, tissue injury, clip migration, and inadequate sampling. Informed written consent was given. The usual time-out protocol was performed immediately prior to the procedure.  Using sterile technique and 2% Lidocaine as local anesthetic, under direct ultrasound visualization, a 12 gauge vacuum-assisteddevice was used to perform biopsy of mass in the 7 o'clock location of the right breast using a medial approach. A tissue marker clip was not deployed.  Following the biopsy the patient reported feeling sleepy with some numbness in the tip of the tongue. He reported no hives, difficulty breathing, facial or mouth swelling, or chest pain. He was observed at the [place] for an additional half hour, and reported feeling normal approximately 20 min later.  IMPRESSION: Ultrasound-guided biopsy of right breast mass. No apparent complications.  Electronically Signed: By: Shon Hale M.D. On: 12/10/2013 16:47    ASSESSMENT: 23 y.o. [place] man with a history of childhood neuroblastoma and adult breast cancer  NEUROBLASTOMA HISTORY: (1) presented with right adrenal and bony metastatic disease November 1984, and  was treated at [facility] under protocol CCG [phone]  (a) received cis-platinum, etoposide, doxorubicin [150/M2 total dose], cyclophosphamide, and cis-retinoic acid   (b) preparatory regimen with melphalan, carboplatin,  and etoposide followed by autologous bone marrow transplant  (c) status post resection of the right adrenal gland and regional lymph nodes March 1995  (d) adjuvant radiation to the right adrenal area (10 Gy) and total body (10Gy) completed June 1995  (2) potential for cataracts, decreased thyroid function, decreased cardiac function, decreased renal function, and hearing loss  BREAST CANCER HISTORY: (1) right breast lower outer quadrant biopsy 12/10/2013 for a clinical T2 N0, stage IIA invasive ductal carcinoma, with papillary features, estrogen receptor 100% positive, progesterone receptor 40% positive, with an MIB-1 of 41% and no HER-2 amplification  (2) genetics testing sent 12/28/2013  PLAN: We spent the better part of today's hour-long appointment discussing the biology of breast cancer in general, and the specifics of the patient's tumor in particular. Philip Marquez understands the difference between local and systemic therapy of breast cancer. Locally he will need a mastectomy and sentinel lymph node sampling. It is not clear whether he will need radiation, especially since he received whole body radiation remotely.  From a systemic point of view, she will clearly benefit from antiestrogen, specifically tamoxifen. If the tumor turns out to be node positive we would consider chemotherapy, but I would be reluctant to give further chemotherapy to this patient, given his prior history, without a strong indication.  The patient likely carries a genetic mutation, which may or may not be BRCA. Even if it were BRCA I am not sure prophylactic contralateral mastectomy is indicated: I can find no data for this in the literature.  At this point the next that is mastectomy. This should take place later this  month, and accordingly I will see Philip Marquez mid-September. We should be able to finalize decisions regarding the rest of his treatment at that point.  The patient has a good understanding of the overall plan. He agrees with it. He knows the goal of treatment in his case is cure. He ill call with any problems that may develop before her next visit here.  Chauncey Cruel, MD   01/01/2014 6:57 PM

## 2014-01-05 ENCOUNTER — Telehealth: Payer: Self-pay | Admitting: Oncology

## 2014-01-05 NOTE — Telephone Encounter (Signed)
, °

## 2014-01-06 ENCOUNTER — Telehealth (INDEPENDENT_AMBULATORY_CARE_PROVIDER_SITE_OTHER): Payer: Self-pay

## 2014-01-06 ENCOUNTER — Ambulatory Visit (INDEPENDENT_AMBULATORY_CARE_PROVIDER_SITE_OTHER): Payer: Self-pay | Admitting: General Surgery

## 2014-01-06 ENCOUNTER — Encounter (INDEPENDENT_AMBULATORY_CARE_PROVIDER_SITE_OTHER): Payer: Self-pay | Admitting: General Surgery

## 2014-01-06 VITALS — BP 120/76 | HR 82 | Temp 98.5°F | Ht 66.0 in | Wt 115.0 lb

## 2014-01-06 DIAGNOSIS — C50521 Malignant neoplasm of lower-outer quadrant of right male breast: Secondary | ICD-10-CM

## 2014-01-06 DIAGNOSIS — C50929 Malignant neoplasm of unspecified site of unspecified male breast: Secondary | ICD-10-CM

## 2014-01-06 NOTE — Progress Notes (Addendum)
Patient ID: Philip Marquez, male   DOB: 06/07/1990, 23 y.o.   MRN: [id number]  History: Philip Marquez returns with his mother for followup discussion regarding definitive surgery for his right breast cancer. He has seen Dr. Jana Hakim who advises proceeding with surgery as the next step. He has had blood drawn for genetic testing, and results are pending. Initial presentation is summarized below.      Philip Marquez is a 23 y.o. male. He was referred by Dr. Jake Shark at the breast center of [facility] for evaluation and management of a newly diagnosed invasive carcinoma of the right breast.      This young man is here with his mother. Past history is significant. He was diagnosed with a right adrenal neuroblastoma at age 87-1/2. He had neoadjuvant chemotherapy, then right adrenalectomy, then full-body radiation therapy and chemotherapy and bone marrow transplant. Most of this was supervised at [facility]. He was followed for several years but is not being actively followed now. He has no other health problems.       1 month ago he noticed drainage from his right nipple but it felt a little bumpy and went for evaluation. No pain. Bilateral Mammogram and right  ultrasound show a 3.1 x 2.7 x 0.6 eccentric masslike area in the right breast, 7:00 position, 2 cm from the nipple. He also reported bilateral gynecomastia. Image guided biopsy shows invasive ductal carcinoma with papillary features, DCIS. ER-positive 100%, PR-positive 40%, Ki-67 41%, HER-2-negative.       Family history reveals breast cancer in a maternal second cousin. No ovarian cancer. A great uncle had neuroblastoma.        He is otherwise healthy. Takes no medications and has no active health problems.        He is working at [employer]. He is also a Building control surveyor at [employer].  Past history, family history, social history, and review of systems are documented on the chart, unchanged, and noncontributory except as described above.  Physical  Exam  Constitutional: He is oriented to person, place, and time. He appears well-developed and well-nourished. No distress.  HENT:  Head: Normocephalic.  Nose: Nose normal.  Mouth/Throat: No oropharyngeal exudate.  Eyes: Conjunctivae and EOM are normal. Pupils are equal, round, and reactive to light. Right eye exhibits no discharge. Left eye exhibits no discharge. No scleral icterus.  Neck: Normal range of motion. Neck supple. No JVD present. No tracheal deviation present. No thyromegaly present.  Cardiovascular: Normal rate, regular rhythm, normal heart sounds and intact distal pulses.  No murmur heard.  Pulmonary/Chest: Effort normal and breath sounds normal. No stridor. No respiratory distress. He has no wheezes. He has no rales. He exhibits no tenderness.  Bilateral mild gynecomastia. Healthy-appearing nipple and areolar skin bilaterally. I cannot elicit a discharge. He does Have a bumpy, mobile mass in the right periareolar area, lower outer. This does not appear to be fixed to the skin or to the chest wall.No axillary adenopathy. No supraclavicular adenopathy.  Abdominal: Soft. Bowel sounds are normal. He exhibits no distension and no mass. There is no tenderness. There is no rebound and no guarding.  Transverse right sided upper abdominal incision well healed. No hernia. No abdominal mass or tenderness. No inguinal adenopathy.  Musculoskeletal: Normal range of motion. He exhibits no edema and no tenderness.  Lymphadenopathy:  He has no cervical adenopathy.  Neurological: He is alert and oriented to person, place, and time. He has normal reflexes. Coordination normal.  Skin: Skin is warm and dry. No rash noted. He is not diaphoretic. No erythema. No pallor.  Psychiatric: He has a normal mood and affect. His behavior is normal. Judgment and thought content normal.   Assessment  Invasive ductal carcinoma with papillary features and DCIS right breast, lower outer quadrant in male. Receptor  positive. HER-2 negative. Clinical stage T2, N0  History right adrenalectomy for neuroblastoma age 2 (N-myc gene associated, but no available genetic testing)  History full-body radiation therapy  History bone marrow transplantation  ADDENDUM (01/26/2014): Genetic testing is negative for no serious fixation.  Plan:     I spent 45 minutes with the patient and his mother discussing treatment plans. They are in agreement that we should proceed with surgery as the next step.      He will be scheduled for right total mastectomy and right axillary sentinel nerve biopsy in the near future. He does not appear to be interested in prophylactic contralateral mastectomy.      I discussed the indications, details, techniques, and numerous risk of surgery with the patient and his mother. He is aware of the risk of bleeding, infection, skin necrosis, reoperation for positive margins, arm swelling, or numbness, shoulder disability, and other unforeseen problems. He understands these issues well and all of his questions are answered. He agrees with this plan.   Edsel Petrin. Dalbert Batman, M.D., [facility] General and Minimally invasive Surgery Breast and Colorectal Surgery Office:   [phone] Pager:   [phone]

## 2014-01-06 NOTE — Patient Instructions (Signed)
You have completed your medical oncology consultation with Dr. Jana Hakim. He also has recommended proceeding with surgery as the next step.  You have had blood drawn for genetic testing, and I will take about 3 weeks to get those results.  We have discussed your options, and we have agreed and decided to proceed with right total mastectomy and right axillary sentinel node biopsy.  We will schedule the surgery at [facility] in the near future.      Mastectomy, With or Without Reconstruction Mastectomy (removal of the breast) is a procedure most commonly used to treat cancer (tumor) of the breast. Different procedures are available for treatment. This depends on the stage of the tumor (abnormal growths). Discuss this with your caregiver, surgeon (a specialist for performing operations such as this), or oncologist (someone specialized in the treatment of cancer). With proper information, you can decide which treatment is best for you. Although the sound of the word cancer is frightening to all of [place], the new treatments and medications can be a source of reassurance and comfort. If there are things you are worried about, discuss them with your caregiver. He or she can help comfort you and your family. Some of the different procedures for treating breast cancer are:  Radical (extensive) mastectomy. This is an operation used to remove the entire breast, the muscles under the breast, and all of the glands (lymph nodes) under the arm. With all of the new treatments available for cancer of the breast, this procedure has become less common.  Modified radical mastectomy. This is a similar operation to the radical mastectomy described above. In the modified radical mastectomy, the muscles of the chest wall are not removed unless one of the lessor muscles is removed. One of the lessor muscles may be removed to allow better removal of the lymph nodes. The axillary lymph nodes are also removed. Rarely, during an  axillary node dissection nerves to this area are damaged. Radiation therapy is then often used to the area following this surgery.  A total mastectomy also known as a complete or simple mastectomy. It involves removal of only the breast. The lymph nodes and the muscles are left in place.  In a lumpectomy, the lump is removed from the breast. This is the simplest form of surgical treatment. A sentinel lymph node biopsy may also be done. Additional treatment may be required. RISKS AND COMPLICATIONS The main problems that follow removal of the breast include:  Infection (germs start growing in the wound). This can usually be treated with antibiotics (medications that kill germs).  Lymphedema. This means the arm on the side of the breast that was operated on swells because the lymph (tissue fluid) cannot follow the main channels back into the body. This only occurs when the lymph nodes have had to be removed under the arm.  There may be some areas of numbness to the upper arm and around the incision (cut by the surgeon) in the breast. This happens because of the cutting of or damage to some of the nerves in the area. This is most often unavoidable.  There may be difficulty moving the arm in a full range of motion (moving in all directions) following surgery. This usually improves with time following use and exercise.  Recurrence of breast cancer may happen with the very best of surgery and follow up treatment. Sometimes small cancer cells that cannot be seen with the naked eye have already spread at the time of surgery. When  this happens other treatment is available. This treatment may be radiation, medications or a combination of both. RECONSTRUCTION Reconstruction of the breast may be done immediately if there is not going to be post-operative radiation. This surgery is done for cosmetic (improve appearance) purposes to improve the physical appearance after the operation. This may be done in two  ways:  It can be done using a saline filled prosthetic (an artificial breast which is filled with salt water). Silicone breast implants are now re-approved by the FDA and are being commonly used.  Reconstruction can be done using the body's own muscle/fat/skin. Your caregiver will discuss your options with you. Depending upon your needs or choice, together you will be able to determine which procedure is best for you. Document Released: 02/06/2001 Document Revised: 02/06/2012 Document Reviewed: 09/30/2007 [facility] Patient Information 2015 [place], [place]. This information is not intended to replace advice given to you by your health care provider. Make sure you discuss any questions you have with your health care provider.

## 2014-01-06 NOTE — Telephone Encounter (Signed)
V/M Appt with DR. Dalbert Batman 01/06/14@145p  to call to confirm

## 2014-01-13 ENCOUNTER — Telehealth (INDEPENDENT_AMBULATORY_CARE_PROVIDER_SITE_OTHER): Payer: Self-pay | Admitting: General Surgery

## 2014-01-13 NOTE — Telephone Encounter (Signed)
Called patient about surgery, went over financial responsibilities with patient, will call back to schedule.

## 2014-01-15 ENCOUNTER — Encounter (INDEPENDENT_AMBULATORY_CARE_PROVIDER_SITE_OTHER): Payer: Self-pay | Admitting: General Surgery

## 2014-01-26 ENCOUNTER — Telehealth (INDEPENDENT_AMBULATORY_CARE_PROVIDER_SITE_OTHER): Payer: Self-pay | Admitting: General Surgery

## 2014-01-26 NOTE — Telephone Encounter (Signed)
Erroneous encounter

## 2014-01-27 ENCOUNTER — Encounter: Payer: Self-pay | Admitting: Genetic Counselor

## 2014-01-27 DIAGNOSIS — Z803 Family history of malignant neoplasm of breast: Secondary | ICD-10-CM

## 2014-01-27 DIAGNOSIS — Z800 Family history of malignant neoplasm of digestive organs: Secondary | ICD-10-CM

## 2014-01-27 NOTE — Progress Notes (Signed)
HPI:  Mr. Mitcham was previously seen in the [place] clinic due to a personal and family history of cancer and concerns regarding a hereditary predisposition to cancer. Please refer to our prior cancer genetics clinic note for more information regarding Mr. Manuele's medical, social and family histories, and our assessment and recommendations, at the time. Mr. Noffke recent genetic test results were disclosed to her, as were recommendations warranted by these results. These results and recommendations are discussed in more detail below.  GENETIC TEST RESULTS: At the time of Mr. Mangano visit, we recommended he pursue genetic testing of the BRCA1, BRCA2 and TP53 genes. These tests, which included sequencing and deletion/duplication analysis of the genes, was performed at [facility]. Genetic testing was normal, and did not reveal a deleterious mutation in these genes. Mr. Sebesta test report will be scanned into [facility].    We discussed with Mr. Kotowski that since the current genetic testing is not perfect, it is possible there may be a gene mutation in one of these genes that current testing cannot detect, but that chance is small.  We also discussed, that it is possible that another gene that has not yet been discovered, or that we have not yet tested, is responsible for the cancer diagnoses in the family, and it is, therefore, important to remain in touch with cancer genetics in the future so that we can continue to offer Mr. Schrom the most up to date genetic testing.   CANCER SCREENING RECOMMENDATIONS: This result is reassuring and suggests that Mr. Behrle cancer was most likely not due to an inherited predisposition associated with one of these genes.  Most cancers happen by chance and this negative test, along with details of his personal and family history, suggests that his cancer falls into this category. It is also possible that the radiation exposure Mr. Bonneau  experienced as a child due to the adrenal neuroblastoma may have had some contribution to his current breast cancer diagnosis. We, therefore, recommended he continue to follow the cancer management and screening guidelines provided by his oncology and primary providers.   RECOMMENDATIONS FOR FAMILY MEMBERS:  Individuals in this family might be at some increased risk of developing cancer, over the general population risk, simply due to the family history of cancer.  We recommended women in this family have a yearly mammogram beginning at age 64, an an annual clinical breast exam, and perform monthly breast self-exams. Women in this family should also have a gynecological exam as recommended by their primary provider. All family members should have a colonoscopy by age 72.  FOLLOW-UP: Lastly, we discussed with Mr. Usrey that cancer genetics is a rapidly advancing field and it is possible that new genetic tests will be appropriate for him and/or her family members in the future. We encouraged him to remain in contact with cancer genetics on an annual basis so we can update his personal and family histories and let him know of advances in cancer genetics that may benefit this family.   Our contact number was provided. Mr. Euceda questions were answered to his satisfaction, and he knows he is welcome to call us at anytime with additional questions or concerns.   Catherine A. Fine, MS, CGC Certified Genetic Counseor [email or web address]

## 2014-02-09 ENCOUNTER — Ambulatory Visit: Payer: Self-pay | Admitting: Oncology

## 2014-02-10 ENCOUNTER — Telehealth: Payer: Self-pay | Admitting: Oncology

## 2014-02-10 NOTE — Telephone Encounter (Signed)
Sent letter to patient from Dr. Magrinat. °

## 2014-02-12 ENCOUNTER — Telehealth: Payer: Self-pay | Admitting: Oncology

## 2014-02-12 ENCOUNTER — Other Ambulatory Visit: Payer: Self-pay | Admitting: Emergency Medicine

## 2014-02-12 NOTE — Telephone Encounter (Signed)
, °

## 2014-02-17 ENCOUNTER — Emergency Department (HOSPITAL_COMMUNITY)
Admission: EM | Admit: 2014-02-17 | Discharge: 2014-02-18 | Disposition: A | Discharge status: Home / Self Care | Payer: Self-pay | Attending: Emergency Medicine | Admitting: Emergency Medicine

## 2014-02-17 ENCOUNTER — Encounter (HOSPITAL_COMMUNITY): Payer: Self-pay | Admitting: Emergency Medicine

## 2014-02-17 DIAGNOSIS — Z85858 Personal history of malignant neoplasm of other endocrine glands: Secondary | ICD-10-CM | POA: Insufficient documentation

## 2014-02-17 DIAGNOSIS — Z9481 Bone marrow transplant status: Secondary | ICD-10-CM | POA: Insufficient documentation

## 2014-02-17 DIAGNOSIS — Z79899 Other long term (current) drug therapy: Secondary | ICD-10-CM | POA: Insufficient documentation

## 2014-02-17 DIAGNOSIS — R112 Nausea with vomiting, unspecified: Secondary | ICD-10-CM | POA: Insufficient documentation

## 2014-02-17 LAB — COMPREHENSIVE METABOLIC PANEL
ALBUMIN: 4.5 g/dL (ref 3.5–5.2)
ALT: 8 U/L (ref 0–53)
ANION GAP: 11 (ref 5–15)
AST: 18 U/L (ref 0–37)
Alkaline Phosphatase: 49 U/L (ref 39–117)
BILIRUBIN TOTAL: 0.7 mg/dL (ref 0.3–1.2)
BUN: 10 mg/dL (ref 6–23)
CALCIUM: 9.7 mg/dL (ref 8.4–10.5)
CHLORIDE: 102 meq/L (ref 96–112)
CO2: 29 mEq/L (ref 19–32)
CREATININE: 0.89 mg/dL (ref 0.50–1.35)
GFR calc Af Amer: >90 mL/min (ref >90)
GFR calc non Af Amer: >90 mL/min (ref >90)
Glucose, Bld: 92 mg/dL (ref 70–99)
Potassium: 3.9 mEq/L (ref 3.7–5.3)
Sodium: 142 mEq/L (ref 137–147)
TOTAL PROTEIN: 7.5 g/dL (ref 6.0–8.3)

## 2014-02-17 LAB — CBC WITH DIFFERENTIAL/PLATELET
Basophils Absolute: 0 10*3/uL (ref 0.0–0.1)
Basophils Relative: 0 % (ref 0–1)
EOS PCT: 0 % (ref 0–5)
Eosinophils Absolute: 0 10*3/uL (ref 0.0–0.7)
HEMATOCRIT: 37.4 % — AB (ref 39.0–52.0)
HEMOGLOBIN: 13.5 g/dL (ref 13.0–17.0)
LYMPHS PCT: 20 % (ref 12–46)
Lymphs Abs: 1.2 10*3/uL (ref 0.7–4.0)
MCH: 30.8 pg (ref 26.0–34.0)
MCHC: 36.1 g/dL — ABNORMAL HIGH (ref 30.0–36.0)
MCV: 85.2 fL (ref 78.0–100.0)
MONO ABS: 0.5 10*3/uL (ref 0.1–1.0)
Monocytes Relative: 8 % (ref 3–12)
Neutro Abs: 4.4 10*3/uL (ref 1.7–7.7)
Neutrophils Relative %: 72 % (ref 43–77)
Platelets: 188 10*3/uL (ref 150–400)
RBC: 4.39 MIL/uL (ref 4.22–5.81)
RDW: 12.2 % (ref 11.5–15.5)
WBC: 6.2 10*3/uL (ref 4.0–10.5)

## 2014-02-17 NOTE — ED Notes (Signed)
Pt. reports nausea and vomitting / dizziness after a procedure ( CT biopsy of right lymph node) at [facility] this afternoon , alert and oriented/ respirations unlabored .

## 2014-02-18 ENCOUNTER — Encounter (HOSPITAL_COMMUNITY): Payer: Self-pay | Admitting: Emergency Medicine

## 2014-02-18 MED ORDER — ONDANSETRON 4 MG PO TBDP
8.0000 mg | ORAL_TABLET | Freq: Once | ORAL | Status: AC
  Administered 2014-02-18: 8 mg via ORAL
  Filled 2014-02-18: qty 2

## 2014-02-18 MED ORDER — ONDANSETRON HCL 4 MG PO TABS: 4.0000 mg | ORAL_TABLET | Freq: Four times a day (QID) | ORAL | Status: DC

## 2014-02-18 NOTE — Discharge Instructions (Signed)

## 2014-02-18 NOTE — ED Provider Notes (Signed)
CSN: [id number]     Arrival date & time 02/17/14  2045 History   First MD Initiated Contact with Patient 02/18/14 0030     Chief Complaint  Patient presents with  . Emesis     (Consider location/radiation/quality/duration/timing/severity/associated sxs/prior Treatment) HPI Comments: Pt developed severe nausea and multiple episodes of non-bloody, non-bilious emesis after receiving fentanyl & versed for a lymph node biopsy this afternoon. No ab pain, no fever, no SOB, itching or swelling.   Patient is a 23 y.o. male presenting with vomiting.  Emesis Associated symptoms: no abdominal pain, no diarrhea and no headaches     Past Medical History  Diagnosis Date  . Neuroblastoma of adrenal gland   . Status post autologous bone marrow transplant    Past Surgical History  Procedure Laterality Date  . Adrenalectomy     Family History  Problem Relation Age of Onset  . Cancer Maternal Grandfather 23    colorectal  . Cancer Other 25    mat great uncle (through [facility]) with possible neuroblastoma  . Cancer Other 35    male mat first cousin once removed with breast cancer (related through MGF's sister)   History  Substance Use Topics  . Smoking status: Never Smoker   . Smokeless tobacco: Not on file  . Alcohol Use: No    Review of Systems  Constitutional: Negative for fever, activity change, appetite change and fatigue.  HENT: Negative for congestion, facial swelling, rhinorrhea and trouble swallowing.   Eyes: Negative for photophobia and pain.  Respiratory: Negative for cough, chest tightness and shortness of breath.   Cardiovascular: Negative for chest pain and leg swelling.  Gastrointestinal: Positive for nausea and vomiting. Negative for abdominal pain, diarrhea and constipation.  Endocrine: Negative for polydipsia and polyuria.  Genitourinary: Negative for dysuria, urgency, decreased urine volume and difficulty urinating.  Musculoskeletal: Negative for back pain and gait  problem.  Skin: Negative for color change, rash and wound.  Allergic/Immunologic: Negative for immunocompromised state.  Neurological: Negative for dizziness, facial asymmetry, speech difficulty, weakness, numbness and headaches.  Psychiatric/Behavioral: Negative for confusion, decreased concentration and agitation.      Allergies  Review of patient's allergies indicates no known allergies.  Home Medications   Prior to Admission medications   Medication Sig Start Date End Date Taking? Authorizing Provider  diphenhydrAMINE (BENADRYL) 25 MG tablet Take 25 mg by mouth 2 (two) times daily.    Historical Provider, MD  fentaNYL 50 mcg/mL injection Inject 50 mcg into the vein once.    Historical Provider, MD  midazolam (VERSED) 5 MG/ML injection Inject 1 mg into the vein once.    Historical Provider, MD  ondansetron (ZOFRAN) 4 MG tablet Take 1 tablet (4 mg total) by mouth every 6 (six) hours. 02/18/14   Ernestina Patches, MD   BP 125/83  Pulse 59  Temp(Src) 97.6 F (36.4 C) (Oral)  Resp 16  Ht 5\' 6"  (1.676 m)  Wt 115 lb (52.164 kg)  BMI 18.57 kg/m2  SpO2 100% Physical Exam  Constitutional: He is oriented to person, place, and time. He appears well-developed and well-nourished. No distress.  HENT:  Head: Normocephalic and atraumatic.  Mouth/Throat: No oropharyngeal exudate.  Eyes: Pupils are equal, round, and reactive to light.  Neck: Normal range of motion. Neck supple.    Cardiovascular: Normal rate, regular rhythm and normal heart sounds.  Exam reveals no gallop and no friction rub.   No murmur heard. Pulmonary/Chest: Effort normal and breath sounds normal. No respiratory  distress. He has no wheezes. He has no rales.  Abdominal: Soft. Bowel sounds are normal. He exhibits no distension and no mass. There is no tenderness. There is no rebound and no guarding.  Musculoskeletal: Normal range of motion. He exhibits no edema and no tenderness.  Neurological: He is alert and oriented to  person, place, and time.  Skin: Skin is warm and dry.  Psychiatric: He has a normal mood and affect.    ED Course  Procedures (including critical care time) Labs Review Labs Reviewed  CBC WITH DIFFERENTIAL - Abnormal; Notable for the following:    HCT 37.4 (*)    MCHC 36.1 (*)    All other components within normal limits  COMPREHENSIVE METABOLIC PANEL    Imaging Review No results found.   EKG Interpretation None      MDM   Final diagnoses:  Non-intractable vomiting with nausea, vomiting of unspecified type    Pt is a 23 y.o. male with Pmhx as above who presents with n/v after having a lymph node biopsy with sedation with fentanyl/versed. He received 8mg  ODT zofran from triage and is feeling much better. No acute lab abnormalities. Pt tolerated PO in dept and will be givnen rx for zofran. Suspect medication rnx. Return precautions given for new or worsening symptoms including inability to tolerate PO, fever.          Ernestina Patches, MD 02/18/14 [phone]

## 2014-02-18 NOTE — ED Notes (Signed)
Pt tolerated fluids 

## 2014-02-18 NOTE — ED Notes (Signed)
Pt refused wheelchair.  

## 2014-03-22 DIAGNOSIS — F411 Generalized anxiety disorder: Secondary | ICD-10-CM | POA: Insufficient documentation

## 2014-04-05 ENCOUNTER — Ambulatory Visit: Payer: Self-pay | Admitting: Oncology

## 2015-10-05 ENCOUNTER — Encounter (HOSPITAL_COMMUNITY): Payer: Self-pay

## 2015-10-05 ENCOUNTER — Emergency Department (HOSPITAL_COMMUNITY)
Admission: EM | Admit: 2015-10-05 | Discharge: 2015-10-05 | Disposition: A | Discharge status: Home / Self Care | Payer: Self-pay | Attending: Emergency Medicine | Admitting: Emergency Medicine

## 2015-10-05 DIAGNOSIS — Z85858 Personal history of malignant neoplasm of other endocrine glands: Secondary | ICD-10-CM | POA: Insufficient documentation

## 2015-10-05 DIAGNOSIS — Z9889 Other specified postprocedural states: Secondary | ICD-10-CM | POA: Insufficient documentation

## 2015-10-05 DIAGNOSIS — Z79899 Other long term (current) drug therapy: Secondary | ICD-10-CM | POA: Insufficient documentation

## 2015-10-05 DIAGNOSIS — Z9481 Bone marrow transplant status: Secondary | ICD-10-CM | POA: Insufficient documentation

## 2015-10-05 DIAGNOSIS — K0889 Other specified disorders of teeth and supporting structures: Secondary | ICD-10-CM | POA: Insufficient documentation

## 2015-10-05 MED ORDER — CLINDAMYCIN HCL 150 MG PO CAPS
450.0000 mg | ORAL_CAPSULE | Freq: Once | ORAL | Status: AC
  Administered 2015-10-05: 450 mg via ORAL
  Filled 2015-10-05: qty 3

## 2015-10-05 MED ORDER — OXYCODONE-ACETAMINOPHEN 5-325 MG PO TABS: 2.0000 | ORAL_TABLET | ORAL | Status: DC | PRN

## 2015-10-05 MED ORDER — CLINDAMYCIN HCL 150 MG PO CAPS: 300.0000 mg | ORAL_CAPSULE | Freq: Three times a day (TID) | ORAL | Status: DC

## 2015-10-05 MED ORDER — FENTANYL CITRATE (PF) 100 MCG/2ML IJ SOLN
50.0000 ug | INTRAMUSCULAR | Status: DC | PRN
  Filled 2015-10-05: qty 2

## 2015-10-05 MED ORDER — OXYCODONE-ACETAMINOPHEN 5-325 MG PO TABS
2.0000 | ORAL_TABLET | Freq: Once | ORAL | Status: AC
  Administered 2015-10-05: 2 via ORAL
  Filled 2015-10-05: qty 2

## 2015-10-05 MED ORDER — DEXAMETHASONE SODIUM PHOSPHATE 10 MG/ML IJ SOLN
10.0000 mg | Freq: Once | INTRAMUSCULAR | Status: AC
  Administered 2015-10-05: 10 mg via INTRAMUSCULAR
  Filled 2015-10-05: qty 1

## 2015-10-05 NOTE — ED Provider Notes (Signed)
CSN: [id number]     Arrival date & time 10/05/15  [id number] History   First MD Initiated Contact with Patient 10/05/15 0320     Chief Complaint  Patient presents with  . Dental Pain    HPI   Philip Marquez is a 25 y.o. male with a PMH of neuroblastoma of adrenal gland and autologous bone marrow transplant who presents to the ED with left sided dental pain and swelling. He reports he had his wisdom tooth extracted on the 29th and has had pain and swelling since that time. He states he was placed on a steroid course, and started to experience symptom improvement, however woke up this morning with increased pain and swelling. He states his symptoms are constant. He denies exacerbating factors. He called his dentist today and was started on amoxicillin. He states he has an appointment to follow-up with his dentist tomorrow morning at 8 AM. He denies difficulty swallowing, trouble handling his secretions, fever, chills.   Past Medical History  Diagnosis Date  . Status post autologous bone marrow transplant ([place])   . Neuroblastoma of adrenal gland [facility])    Past Surgical History  Procedure Laterality Date  . Adrenalectomy     Family History  Problem Relation Age of Onset  . Cancer Maternal Grandfather 20    colorectal  . Cancer Other 25    mat great uncle (through [facility]) with possible neuroblastoma  . Cancer Other 87    male mat first cousin once removed with breast cancer (related through MGF's sister)   Social History  Substance Use Topics  . Smoking status: Never Smoker   . Smokeless tobacco: None  . Alcohol Use: No      Review of Systems  Constitutional: Negative for fever and chills.  HENT: Positive for dental problem. Negative for drooling and trouble swallowing.   Respiratory: Negative for shortness of breath.   All other systems reviewed and are negative.     Allergies  Bactrim  Home Medications   Prior to Admission medications   Medication Sig Start Date End Date  Taking? Authorizing Provider  clindamycin (CLEOCIN) 150 MG capsule Take 2 capsules (300 mg total) by mouth 3 (three) times daily. May dispense as 150mg  capsules 10/05/15   Marella Chimes, PA-C  diphenhydrAMINE (BENADRYL) 25 MG tablet Take 25 mg by mouth 2 (two) times daily.    Historical Provider, MD  fentaNYL 50 mcg/mL injection Inject 50 mcg into the vein once.    Historical Provider, MD  midazolam (VERSED) 5 MG/ML injection Inject 1 mg into the vein once.    Historical Provider, MD  ondansetron (ZOFRAN) 4 MG tablet Take 1 tablet (4 mg total) by mouth every 6 (six) hours. 02/18/14   Ernestina Patches, MD  oxyCODONE-acetaminophen (PERCOCET/ROXICET) 5-325 MG tablet Take 2 tablets by mouth every 4 (four) hours as needed for severe pain. 10/05/15   Elizabeth C Westfall, PA-C    BP 138/98 mmHg  Pulse 85  Temp(Src) 98.7 F (37.1 C) (Oral)  Resp 16  SpO2 99% Physical Exam  Constitutional: He is oriented to person, place, and time. He appears well-developed and well-nourished. No distress.  HENT:  Head: Normocephalic and atraumatic.  Right Ear: External ear normal.  Left Ear: External ear normal.  Nose: Nose normal.  Mouth/Throat: Uvula is midline and mucous membranes are normal. No oropharyngeal exudate, posterior oropharyngeal edema, posterior oropharyngeal erythema or tonsillar abscesses.  TTP to left lower gumline with associated edema. No swelling to floor  of mouth.   Eyes: Conjunctivae and EOM are normal. Right eye exhibits no discharge. Left eye exhibits no discharge. No scleral icterus.  Neck: Normal range of motion. Neck supple.  Cardiovascular: Normal rate and regular rhythm.   Pulmonary/Chest: Effort normal and breath sounds normal. No respiratory distress.  Musculoskeletal: Normal range of motion. He exhibits no edema or tenderness.  Neurological: He is alert and oriented to person, place, and time.  Skin: Skin is warm and dry. He is not diaphoretic.  Psychiatric: He has a normal  mood and affect. His behavior is normal.  Nursing note and vitals reviewed.   ED Course  Procedures (including critical care time)  Labs Review Labs Reviewed - No data to display  Imaging Review No results found.    EKG Interpretation None      MDM   Final diagnoses:  Pain, dental    25 year old male presents with left sided facial pain and swelling. Notes his wisdom tooth was extracted on the 29th and has had swelling since that time (which transiently improved after a short steroid course, completed this yesterday). Called his dentist today and was started on amoxicillin and scheduled for follow-up in clinic in the AM. Patient is afebrile. Vital signs stable. TTP to left lower gumline with associated edema. No swelling to floor of mouth to suggest ludwigs angina. No obvious abscess amenable to incision and drainage. Patient handling his secretions well. Will give dose of antibiotic, pain medication, and steroid. On reassessment of patient, he reports significant symptom improvement. Patient is non-toxic and well-appearing, feel he is stable for discharge at this time. Will give clindamycin and pain medication for home. Patient to follow-up with his dentist as scheduled (now this morning). Return precautions discussed. Patient verbalizes his understanding and is in agreement with plan.  BP 138/98 mmHg  Pulse 85  Temp(Src) 98.7 F (37.1 C) (Oral)  Resp 16  SpO2 99%       Marella Chimes, PA-C 10/05/15 [id number]  Everlene Balls, MD 10/05/15 [phone]

## 2015-10-05 NOTE — ED Notes (Addendum)
Pt here with c/o dental pain and left cheek/jaw appears swollen. He had left lower wisdom tooth extracted on April 29 and it started swelling before this episode and he went to dentist and was prescribed steroids, took last pill yesterday morning. Today, he woke up with worsening left cheek/jaw swelling. He reports dizziness.

## 2015-10-05 NOTE — ED Notes (Signed)
Patient reports taking motrin 800mg  around 0130 this morning for pain.

## 2015-10-05 NOTE — ED Notes (Signed)
Benjamine Mola, PA-C, at the bedside.

## 2015-10-05 NOTE — Discharge Instructions (Signed)
1. Medications: clindamycin (antibiotic), percocet (pain medication), usual home medications 2. Treatment: rest, drink plenty of fluids 3. Follow Up: please followup with your dentist tomorrow morning as scheduled for discussion of your diagnoses and further evaluation after today's visit; if you do not have a primary care doctor use the phone number listed in your discharge paperwork to find one; please return to the ER for high fever, increased pain, new or worsening symptoms

## 2016-07-01 ENCOUNTER — Emergency Department (HOSPITAL_COMMUNITY)
Admission: EM | Admit: 2016-07-01 | Discharge: 2016-07-01 | Disposition: A | Discharge status: Home / Self Care | Payer: Self-pay | Attending: Emergency Medicine | Admitting: Emergency Medicine

## 2016-07-01 ENCOUNTER — Emergency Department (HOSPITAL_COMMUNITY): Payer: Self-pay

## 2016-07-01 ENCOUNTER — Encounter (HOSPITAL_COMMUNITY): Payer: Self-pay

## 2016-07-01 DIAGNOSIS — R1031 Right lower quadrant pain: Secondary | ICD-10-CM

## 2016-07-01 DIAGNOSIS — R1900 Intra-abdominal and pelvic swelling, mass and lump, unspecified site: Secondary | ICD-10-CM | POA: Insufficient documentation

## 2016-07-01 DIAGNOSIS — N50811 Right testicular pain: Secondary | ICD-10-CM

## 2016-07-01 DIAGNOSIS — R1111 Vomiting without nausea: Secondary | ICD-10-CM

## 2016-07-01 DIAGNOSIS — IMO0002 Reserved for concepts with insufficient information to code with codable children: Secondary | ICD-10-CM

## 2016-07-01 DIAGNOSIS — R229 Localized swelling, mass and lump, unspecified: Secondary | ICD-10-CM

## 2016-07-01 DIAGNOSIS — N4400 Torsion of testis, unspecified: Secondary | ICD-10-CM

## 2016-07-01 DIAGNOSIS — R1110 Vomiting, unspecified: Secondary | ICD-10-CM | POA: Insufficient documentation

## 2016-07-01 DIAGNOSIS — Z85858 Personal history of malignant neoplasm of other endocrine glands: Secondary | ICD-10-CM | POA: Insufficient documentation

## 2016-07-01 DIAGNOSIS — Z79899 Other long term (current) drug therapy: Secondary | ICD-10-CM | POA: Insufficient documentation

## 2016-07-01 LAB — CBC
HCT: 36.3 % — ABNORMAL LOW (ref 39.0–52.0)
Hemoglobin: 12.6 g/dL — ABNORMAL LOW (ref 13.0–17.0)
MCH: 30 pg (ref 26.0–34.0)
MCHC: 34.7 g/dL (ref 30.0–36.0)
MCV: 86.4 fL (ref 78.0–100.0)
Platelets: 197 10*3/uL (ref 150–400)
RBC: 4.2 MIL/uL — ABNORMAL LOW (ref 4.22–5.81)
RDW: 13.3 % (ref 11.5–15.5)
WBC: 8.5 10*3/uL (ref 4.0–10.5)

## 2016-07-01 LAB — URINALYSIS, ROUTINE W REFLEX MICROSCOPIC
Bilirubin Urine: NEGATIVE
Glucose, UA: NEGATIVE mg/dL
HGB URINE DIPSTICK: NEGATIVE
Ketones, ur: NEGATIVE mg/dL
Leukocytes, UA: NEGATIVE
NITRITE: NEGATIVE
PH: 5 (ref 5.0–8.0)
Protein, ur: NEGATIVE mg/dL
Specific Gravity, Urine: 1.009 (ref 1.005–1.030)

## 2016-07-01 LAB — COMPREHENSIVE METABOLIC PANEL
ALT: 14 U/L — AB (ref 17–63)
AST: 22 U/L (ref 15–41)
Albumin: 4.1 g/dL (ref 3.5–5.0)
Alkaline Phosphatase: 44 U/L (ref 38–126)
Anion gap: 10 (ref 5–15)
BILIRUBIN TOTAL: 0.2 mg/dL — AB (ref 0.3–1.2)
BUN: 9 mg/dL (ref 6–20)
CO2: 28 mmol/L (ref 22–32)
Calcium: 9.5 mg/dL (ref 8.9–10.3)
Chloride: 104 mmol/L (ref 101–111)
Creatinine, Ser: 0.98 mg/dL (ref 0.61–1.24)
GFR calc Af Amer: >60 mL/min (ref >60)
GFR calc non Af Amer: >60 mL/min (ref >60)
Glucose, Bld: 110 mg/dL — ABNORMAL HIGH (ref 65–99)
Potassium: 3.8 mmol/L (ref 3.5–5.1)
Sodium: 142 mmol/L (ref 135–145)
Total Protein: 6.6 g/dL (ref 6.5–8.1)

## 2016-07-01 LAB — LIPASE, BLOOD: Lipase: 17 U/L (ref 11–51)

## 2016-07-01 LAB — LACTATE DEHYDROGENASE: LDH: 127 U/L (ref 98–192)

## 2016-07-01 LAB — URIC ACID: URIC ACID, SERUM: 3.8 mg/dL — AB (ref 4.4–7.6)

## 2016-07-01 MED ORDER — PROMETHAZINE HCL 25 MG PO TABS: 25.0000 mg | ORAL_TABLET | Freq: Four times a day (QID) | ORAL | 0 refills | Status: DC | PRN

## 2016-07-01 MED ORDER — FAMOTIDINE IN NACL 20-0.9 MG/50ML-% IV SOLN
20.0000 mg | Freq: Once | INTRAVENOUS | Status: AC
  Administered 2016-07-01: 20 mg via INTRAVENOUS
  Filled 2016-07-01: qty 50

## 2016-07-01 MED ORDER — HYDROMORPHONE HCL 2 MG/ML IJ SOLN
0.5000 mg | Freq: Once | INTRAMUSCULAR | Status: AC
  Administered 2016-07-01: 0.5 mg via INTRAVENOUS
  Filled 2016-07-01: qty 1

## 2016-07-01 MED ORDER — ONDANSETRON HCL 4 MG/2ML IJ SOLN
4.0000 mg | Freq: Once | INTRAMUSCULAR | Status: AC
  Administered 2016-07-01: 4 mg via INTRAVENOUS
  Filled 2016-07-01: qty 2

## 2016-07-01 MED ORDER — PROMETHAZINE HCL 25 MG/ML IJ SOLN
25.0000 mg | Freq: Once | INTRAMUSCULAR | Status: AC
  Administered 2016-07-01: 25 mg via INTRAVENOUS
  Filled 2016-07-01: qty 1

## 2016-07-01 MED ORDER — HYDROMORPHONE HCL 2 MG/ML IJ SOLN
0.5000 mg | INTRAMUSCULAR | Status: AC
  Administered 2016-07-01: 0.5 mg via INTRAVENOUS
  Filled 2016-07-01: qty 1

## 2016-07-01 MED ORDER — IOPAMIDOL (ISOVUE-300) INJECTION 61%
INTRAVENOUS | Status: AC
  Administered 2016-07-01: 100 mL
  Filled 2016-07-01: qty 100

## 2016-07-01 MED ORDER — ONDANSETRON 4 MG PO TBDP
4.0000 mg | ORAL_TABLET | Freq: Once | ORAL | Status: AC | PRN
  Administered 2016-07-01: 4 mg via ORAL

## 2016-07-01 MED ORDER — SODIUM CHLORIDE 0.9 % IV BOLUS (SEPSIS)
1000.0000 mL | Freq: Once | INTRAVENOUS | Status: AC
  Administered 2016-07-01: 1000 mL via INTRAVENOUS

## 2016-07-01 MED ORDER — HYDROCODONE-ACETAMINOPHEN 5-325 MG PO TABS: ORAL_TABLET | ORAL | 0 refills | Status: DC

## 2016-07-01 MED ORDER — ONDANSETRON 4 MG PO TBDP
ORAL_TABLET | ORAL | Status: AC
  Filled 2016-07-01: qty 1

## 2016-07-01 NOTE — ED Provider Notes (Signed)
Philip Marquez Provider Note   CSN: [id number] Arrival date & time: 07/01/16  0600     History   Chief Complaint Chief Complaint  Patient presents with  . Abdominal Pain    HPI   Blood pressure 122/78, pulse 90, temperature 98.6 F (37 C), temperature source Oral, resp. rate 13, height 5\' 7"  (1.702 m), weight 54.4 kg, SpO2 100 %.  Philip Marquez is a 26 y.o. male with past medical history significant for neuroblastoma of the adrenal gland at age 30, breast cancer, basal cell carcinoma complaining of acute right lower quadrant pain which woke him from sleep at about 4 AM followed with multiple episodes of nonbloody, nonbilious, non-coffee ground emesis. He rates his pain at 6 out of 10. He denies fevers, chills, testicular pain/swelling, dysuria, diarrhea, sick contacts. Last solid foods were last night he did have sips of water this morning. States he has some chest burning and irritation onset after the vomiting.  Past Medical History:  Diagnosis Date  . Neuroblastoma of adrenal gland ([place])   . Status post autologous bone marrow transplant [facility])     Patient Active Problem List   Diagnosis Date Noted  . Neuroblastoma of adrenal gland ([place]) 12/28/2013  . Family history of malignant neoplasm of gastrointestinal tract 12/28/2013  . Family history of malignant neoplasm of breast 12/28/2013  . Breast cancer of lower-outer quadrant of right male breast ([name]) 12/24/2013    Past Surgical History:  Procedure Laterality Date  . ADRENALECTOMY         Home Medications    Prior to Admission medications   Medication Sig Start Date End Date Taking? Authorizing Provider  clindamycin (CLEOCIN) 150 MG capsule Take 2 capsules (300 mg total) by mouth 3 (three) times daily. May dispense as 150mg  capsules Patient not taking: Reported on 07/01/2016 10/05/15   Marella Chimes, PA-C  HYDROcodone-acetaminophen (NORCO/VICODIN) 5-325 MG tablet Take 1-2 tablets by mouth every 6 hours as  needed for pain and/or cough. 07/01/16   Alyzza Andringa, PA-C  ondansetron (ZOFRAN) 4 MG tablet Take 1 tablet (4 mg total) by mouth every 6 (six) hours. Patient not taking: Reported on 07/01/2016 02/18/14   Ernestina Patches, MD  oxyCODONE-acetaminophen (PERCOCET/ROXICET) 5-325 MG tablet Take 2 tablets by mouth every 4 (four) hours as needed for severe pain. Patient not taking: Reported on 07/01/2016 10/05/15   Marella Chimes, PA-C  promethazine (PHENERGAN) 25 MG tablet Take 1 tablet (25 mg total) by mouth every 6 (six) hours as needed for nausea or vomiting. 07/01/16   Elmyra Ricks Nixxon Faria, PA-C    Family History Family History  Problem Relation Age of Onset  . Cancer Maternal Grandfather 68    colorectal  . Cancer Other 25    mat great uncle (through [facility]) with possible neuroblastoma  . Cancer Other 37    male mat first cousin once removed with breast cancer (related through MGF's sister)    Social History Social History  Substance Use Topics  . Smoking status: Never Smoker  . Smokeless tobacco: Not on file  . Alcohol use No     Allergies   Bactrim [sulfamethoxazole-trimethoprim]   Review of Systems Review of Systems  10 systems reviewed and found to be negative, except as noted in the HPI.   Physical Exam Updated Vital Signs BP 105/70 (BP Location: Right Arm)   Pulse 84   Temp 98.6 F (37 C) (Oral)   Resp 12   Ht 5\' 7"  (1.702 m)  Wt 54.4 kg   SpO2 100%   BMI 18.79 kg/m   Physical Exam  Constitutional: He is oriented to person, place, and time. He appears well-developed and well-nourished. No distress.  HENT:  Head: Normocephalic and atraumatic.  Mouth/Throat: Oropharynx is clear and moist.  Eyes: Conjunctivae and EOM are normal. Pupils are equal, round, and reactive to light.  Neck: Normal range of motion.  Cardiovascular: Normal rate, regular rhythm and intact distal pulses.   Pulmonary/Chest: Effort normal and breath sounds normal.  Abdominal: Soft. He  exhibits no distension and no mass. There is tenderness. There is no rebound and no guarding. No hernia.  Hypoactive bowel sounds, especially tender to palpation over McBurney's point, Rovsing, psoas and obturator are positive.  Musculoskeletal: Normal range of motion.  Neurological: He is alert and oriented to person, place, and time.  No point tenderness to percussion of lumbar spinal processes.  No TTP or paraspinal muscular spasm. Strength is 5 out of 5 to bilateral lower extremities at hip and knee; extensor hallucis longus 5 out of 5. Ankle strength 5 out of 5, no clonus, neurovascularly intact. No saddle anaesthesia. Patellar reflexes are 2+ bilaterally.    Ambulatory with a coordinated in now antalgic gait   Skin: He is not diaphoretic.  Psychiatric: He has a normal mood and affect.  Nursing note and vitals reviewed.    ED Treatments / Results  Labs (all labs ordered are listed, but only abnormal results are displayed) Labs Reviewed  COMPREHENSIVE METABOLIC PANEL - Abnormal; Notable for the following:       Result Value   Glucose, Bld 110 (*)    ALT 14 (*)    Total Bilirubin 0.2 (*)    All other components within normal limits  CBC - Abnormal; Notable for the following:    RBC 4.20 (*)    Hemoglobin 12.6 (*)    HCT 36.3 (*)    All other components within normal limits  URINALYSIS, ROUTINE W REFLEX MICROSCOPIC - Abnormal; Notable for the following:    Color, Urine STRAW (*)    All other components within normal limits  URIC ACID - Abnormal; Notable for the following:    Uric Acid, Serum 3.8 (*)    All other components within normal limits  LIPASE, BLOOD  LACTATE DEHYDROGENASE  AFP TUMOR MARKER  BETA HCG, QUANT    EKG  EKG Interpretation None       Radiology Dg Chest 2 View  Result Date: 07/01/2016 CLINICAL DATA:  Cough. Vomiting. History of neuroblastoma of adrenal gland status post resection. EXAM: CHEST  2 VIEW COMPARISON:  02/07/2010 chest radiograph.  FINDINGS: Surgical clips overlie the right axilla. Stable cardiomediastinal silhouette with normal heart size. No pneumothorax. No pleural effusion. Lungs appear clear, with no acute consolidative airspace disease and no pulmonary edema. Surgical clips are seen in the right upper abdomen. IMPRESSION: No active cardiopulmonary disease. Electronically Signed   By: Ilona Sorrel M.D.   On: 07/01/2016 13:48   US Scrotum  Result Date: 07/01/2016 CLINICAL DATA:  Right scrotal pain since this morning. EXAM: SCROTAL ULTRASOUND DOPPLER ULTRASOUND OF THE TESTICLES TECHNIQUE: Complete ultrasound examination of the testicles, epididymis, and other scrotal structures was performed. Color and spectral Doppler ultrasound were also utilized to evaluate blood flow to the testicles. COMPARISON:  CT scan 07/01/2016 FINDINGS: Right testicle Measurements: 2.4 x 1.0 x 2.1 cm. Symmetric and homogeneous echotexture without focal lesion. Patent arterial and venous blood flow. Left testicle Measurements: 2.0 x  1.1 x 1.3 cm. Symmetric and homogeneous echotexture without focal lesion. Patent arterial and venous blood flow. Right epididymis:  Small right epididymal cyst. Left epididymis:  Normal in size and appearance. Hydrocele:  None visualized. Varicocele:  None visualized. Pulsed Doppler interrogation of both testes demonstrates normal low resistance arterial and venous waveforms bilaterally. IMPRESSION: 1. Normal sonographic appearance of both testicles. Patent intratesticular blood flow. 2. Small right epididymal cyst. Electronically Signed   By: Marijo Sanes M.D.   On: 07/01/2016 14:16   Ct Abdomen Pelvis W Contrast  Result Date: 07/01/2016 CLINICAL DATA:  26 year old male with right lower quadrant abdominal pain nausea and vomiting since 0500 hours. Initial encounter. Medical history states neuroblastoma,bone marrow transplant, right male breast cancer. EXAM: CT ABDOMEN AND PELVIS WITH CONTRAST TECHNIQUE: Multidetector CT imaging  of the abdomen and pelvis was performed using the standard protocol following bolus administration of intravenous contrast. CONTRAST:  18mL ISOVUE-300 IOPAMIDOL (ISOVUE-300) INJECTION 61% COMPARISON:  Chest in abdominal series 915 2011 FINDINGS: Lower chest: Small calcified granuloma in the right lower lobe. Otherwise normal lung bases. No pericardial or pleural effusion. Hepatobiliary: Negative liver and gallbladder. No biliary ductal enlargement. Pancreas: Negative. Spleen: Negative. Adrenals/Urinary Tract: Normal left adrenal gland. The right adrenal gland appears to be surgically absent. Both kidneys demonstrate numerous small round low-density areas which most resemble benign cysts. Most of these are too small to characterize. No perinephric stranding. No definite hydronephrosis. No delayed renal images are provided. No hydroureter. Mildly distended but otherwise unremarkable urinary bladder. Stomach/Bowel: Rectosigmoid retained stool, 5 cm stool ball in the rectum. Redundant sigmoid colon, at the proximal segment is decompressed. The left colon is decompressed. The transverse colon is decompressed. The right colon and appendix are normal (Series 201, image 49 and coronal image 39. No dilated small bowel. Negative stomach and duodenum. Vascular/Lymphatic: Normal.  Portal venous system is patent. No lymphadenopathy identified. Reproductive: Negative. Other: No pelvic free fluid. Musculoskeletal: Mild chronic or congenital T9 and L3 vertebral body deformities. There is an associated 3 cm left L1-L2 cystic appearing paraspinal mass with smooth remodeling of the left L1 neural foramen. The mass extends into the superior left psoas muscle (series 21, image 29). Otherwise no acute or suspicious osseous lesion identified. IMPRESSION: 1. Normal appendix. No inflammatory process identified in the abdomen or pelvis. 2. Indeterminate 3 cm left L1-L2 cystic paraspinal mass. Extension from and smooth remodeling of the left  L1 neural foramen suggest a chronic benign process such as pseudomeningocele. However, in this patient with a history of neuroblastoma, ganglioneuroma or ganglioneuroblastoma should be considered. Correlation with any prior Body CT or Lumbar Spine cross-sectional imaging would be most valuable - as chronic stability would strongly indicate benignity. If any prior study can be made available to me I will compare and issue an addendum. 3. Surgically absent right adrenal gland. No metastatic disease identified. Electronically Signed   By: Genevie Ann M.D.   On: 07/01/2016 11:25   [place] Art/ven Flow Abd Pelv Doppler  Result Date: 07/01/2016 CLINICAL DATA:  Right scrotal pain since this morning. EXAM: SCROTAL ULTRASOUND DOPPLER ULTRASOUND OF THE TESTICLES TECHNIQUE: Complete ultrasound examination of the testicles, epididymis, and other scrotal structures was performed. Color and spectral Doppler ultrasound were also utilized to evaluate blood flow to the testicles. COMPARISON:  CT scan 07/01/2016 FINDINGS: Right testicle Measurements: 2.4 x 1.0 x 2.1 cm. Symmetric and homogeneous echotexture without focal lesion. Patent arterial and venous blood flow. Left testicle Measurements: 2.0 x 1.1 x 1.3  cm. Symmetric and homogeneous echotexture without focal lesion. Patent arterial and venous blood flow. Right epididymis:  Small right epididymal cyst. Left epididymis:  Normal in size and appearance. Hydrocele:  None visualized. Varicocele:  None visualized. Pulsed Doppler interrogation of both testes demonstrates normal low resistance arterial and venous waveforms bilaterally. IMPRESSION: 1. Normal sonographic appearance of both testicles. Patent intratesticular blood flow. 2. Small right epididymal cyst. Electronically Signed   By: Marijo Sanes M.D.   On: 07/01/2016 14:16    Procedures Procedures (including critical care time)  Medications Ordered in ED Medications  ondansetron (ZOFRAN-ODT) 4 MG disintegrating tablet (not  administered)  ondansetron (ZOFRAN-ODT) disintegrating tablet 4 mg (4 mg Oral Given 07/01/16 0615)  sodium chloride 0.9 % bolus 1,000 mL (0 mLs Intravenous Stopped 07/01/16 1257)  HYDROmorphone (DILAUDID) injection 0.5 mg (0.5 mg Intravenous Given 07/01/16 0928)  ondansetron (ZOFRAN) injection 4 mg (4 mg Intravenous Given 07/01/16 0933)  famotidine (PEPCID) IVPB 20 mg premix (0 mg Intravenous Stopped 07/01/16 1103)  iopamidol (ISOVUE-300) 61 % injection (100 mLs  Contrast Given 07/01/16 1016)  HYDROmorphone (DILAUDID) injection 0.5 mg (0.5 mg Intravenous Given 07/01/16 1057)  promethazine (PHENERGAN) injection 25 mg (25 mg Intravenous Given 07/01/16 1500)  sodium chloride 0.9 % bolus 1,000 mL (1,000 mLs Intravenous New Bag/Given 07/01/16 1500)     Initial Impression / Assessment and Plan / ED Course  I have reviewed the triage vital signs and the nursing notes.  Pertinent labs & imaging results that were available during my care of the patient were reviewed by me and considered in my medical decision making (see chart for details).     Vitals:   07/01/16 1300 07/01/16 1415 07/01/16 1430 07/01/16 1442  BP: 119/75 113/80 105/70 105/70  Pulse: 88 90 90 84  Resp: 16   12  Temp:      TempSrc:      SpO2: 100% 99% 98% 100%  Weight:      Height:        Medications  ondansetron (ZOFRAN-ODT) 4 MG disintegrating tablet (not administered)  ondansetron (ZOFRAN-ODT) disintegrating tablet 4 mg (4 mg Oral Given 07/01/16 0615)  sodium chloride 0.9 % bolus 1,000 mL (0 mLs Intravenous Stopped 07/01/16 1257)  HYDROmorphone (DILAUDID) injection 0.5 mg (0.5 mg Intravenous Given 07/01/16 0928)  ondansetron (ZOFRAN) injection 4 mg (4 mg Intravenous Given 07/01/16 0933)  famotidine (PEPCID) IVPB 20 mg premix (0 mg Intravenous Stopped 07/01/16 1103)  iopamidol (ISOVUE-300) 61 % injection (100 mLs  Contrast Given 07/01/16 1016)  HYDROmorphone (DILAUDID) injection 0.5 mg (0.5 mg Intravenous Given 07/01/16 1057)  promethazine  (PHENERGAN) injection 25 mg (25 mg Intravenous Given 07/01/16 1500)  sodium chloride 0.9 % bolus 1,000 mL (1,000 mLs Intravenous New Bag/Given 07/01/16 1500)    MARIEL SWAYZER is 26 y.o. male presenting with Acute onset of severe right lower quadrant pain followed by emesis. No other associated symptoms. Patient is nontoxic appearing, vital signs stable. Advised him to remain nothing by mouth pending CT. CBC without leukocytosis.  CT shows a normal appendix and no inflammation there is a 3 cm left L1-L2 cystic paraspinal mass aspects suggestive of chronic benign process such as a pseudomeningocele however with his history of neuroblastoma malignancy is considered.  Discussed with an updated family. They state that he has had scans he follows at [facility] his doctors are golden Ernst Bowler. He had a whole-body scan after the mastectomy in 2015. States that he was given contrast possibly MRI versus CT.  Discuss with radiologist Dr. Nevada Crane who states that there is a way in packs to push images in between institutions. Unfortunately, it must be done during business hours by IT. He says that he will initiate the request. I also asked him to push our images to the [facility] system so his oncologist can follow with him as well.  I discussed this with the pediatric oncologist Katheren Shams at [facility], she has also looked through his imaging and it does not appear that there is a good study to compare it to. We don't have that images this in the past however the PET scan wouldn't necessarily fully image normal tissue. She is assess to obtain LDH uric acid AFP and beta hCG tumor markers. She also asked for a chest x-ray. She will recheck out to this patient's oncologist Dr. Girtha Rm however, he is out of town until Tuesday.  Chest x-ray and testicular ultrasound negative. Patient is tolerating by mouth's and repeat abdominal exam is unchanged. Patient and his family are given a physical copy of the CD with the CAT  scan. I've advised him of the findings and plan and answered all questions to the best of my ability, they will wait for Dr. Victorio Palm call and will recheck to him if they don't hear from him by Tuesday. They're invited to return to the emergency department for any new or worsening symptoms. Work and school notes are provided.  Evaluation does not show pathology that would require ongoing emergent intervention or inpatient treatment. Pt is hemodynamically stable and mentating appropriately. Discussed findings and plan with patient/guardian, who agrees with care plan. All questions answered. Return precautions discussed and outpatient follow up given.      Final Clinical Impressions(s) / ED Diagnoses   Final diagnoses:  Pain, abdominal, RLQ  Mass  Vomiting without nausea, intractability of vomiting not specified, unspecified vomiting type  RLQ abdominal pain    New Prescriptions New Prescriptions   HYDROCODONE-ACETAMINOPHEN (NORCO/VICODIN) 5-325 MG TABLET    Take 1-2 tablets by mouth every 6 hours as needed for pain and/or cough.   PROMETHAZINE (PHENERGAN) 25 MG TABLET    Take 1 tablet (25 mg total) by mouth every 6 (six) hours as needed for nausea or vomiting.     Monico Blitz, PA-C 07/01/16 [place], MD 07/02/16 [phone]

## 2016-07-01 NOTE — ED Triage Notes (Signed)
Pt states he woke up to sharp cramps on RLQ; pt states some nasua as well; pt states pain at 6/10 on arrival. Pt a&ox 4 on arrival.

## 2016-07-01 NOTE — ED Notes (Signed)
Pt attempting PO fluids

## 2016-07-01 NOTE — ED Notes (Signed)
Pt states he understands instructions and will follow up as directed. Home stable with parents. Taking po fluids and denies pain or nausea.

## 2016-07-01 NOTE — ED Notes (Signed)
Pt vomits 400 cc to emesis bag.

## 2016-07-01 NOTE — ED Notes (Signed)
Pt returns from ct and states pain  increased again. Will in form MD.

## 2016-07-01 NOTE — Discharge Instructions (Signed)
Please follow with your oncologist at [facility] on Tuesday. Don't hesitate to return to the emergency room for any new, worsening or concerning symptoms.  Take vicodin for breakthrough pain, do not drink alcohol, drive, care for children or do other critical tasks while taking vicodin.

## 2016-07-01 NOTE — ED Notes (Signed)
Patient transported to Ultrasound 

## 2016-07-02 LAB — AFP TUMOR MARKER: AFP-Tumor Marker: 2.2 ng/mL (ref 0.0–8.3)

## 2016-07-02 LAB — BETA HCG QUANT (REF LAB): Beta hCG, Tumor Marker: <1 m[IU]/mL (ref 0–3)

## 2016-07-28 ENCOUNTER — Encounter (HOSPITAL_COMMUNITY): Payer: Self-pay | Admitting: *Deleted

## 2016-07-28 ENCOUNTER — Ambulatory Visit (HOSPITAL_COMMUNITY)
Admission: EM | Admit: 2016-07-28 | Discharge: 2016-07-28 | Disposition: A | Discharge status: Home / Self Care | Payer: BLUE CROSS/BLUE SHIELD | Attending: Internal Medicine | Admitting: Internal Medicine

## 2016-07-28 ENCOUNTER — Ambulatory Visit (INDEPENDENT_AMBULATORY_CARE_PROVIDER_SITE_OTHER): Payer: BLUE CROSS/BLUE SHIELD

## 2016-07-28 DIAGNOSIS — M25562 Pain in left knee: Secondary | ICD-10-CM | POA: Diagnosis present

## 2016-07-28 DIAGNOSIS — M25462 Effusion, left knee: Secondary | ICD-10-CM | POA: Insufficient documentation

## 2016-07-28 LAB — CBC WITH DIFFERENTIAL/PLATELET
BASOS ABS: 0 10*3/uL (ref 0.0–0.1)
BASOS PCT: 0 %
EOS PCT: 0 %
Eosinophils Absolute: 0 10*3/uL (ref 0.0–0.7)
HCT: 37.5 % — ABNORMAL LOW (ref 39.0–52.0)
Hemoglobin: 13 g/dL (ref 13.0–17.0)
Lymphocytes Relative: 21 %
Lymphs Abs: 1.7 10*3/uL (ref 0.7–4.0)
MCH: 29.9 pg (ref 26.0–34.0)
MCHC: 34.7 g/dL (ref 30.0–36.0)
MCV: 86.2 fL (ref 78.0–100.0)
MONO ABS: 1.2 10*3/uL — AB (ref 0.1–1.0)
Monocytes Relative: 16 %
Neutro Abs: 4.9 10*3/uL (ref 1.7–7.7)
Neutrophils Relative %: 63 %
PLATELETS: 187 10*3/uL (ref 150–400)
RBC: 4.35 MIL/uL (ref 4.22–5.81)
RDW: 13.1 % (ref 11.5–15.5)
WBC: 7.8 10*3/uL (ref 4.0–10.5)

## 2016-07-28 MED ORDER — HYDROCODONE-ACETAMINOPHEN 5-325 MG PO TABS
2.0000 | ORAL_TABLET | Freq: Once | ORAL | Status: AC
  Administered 2016-07-28: 2 via ORAL

## 2016-07-28 MED ORDER — IBUPROFEN 800 MG PO TABS: 800.0000 mg | ORAL_TABLET | Freq: Three times a day (TID) | ORAL | 0 refills | Status: AC

## 2016-07-28 MED ORDER — ONDANSETRON 4 MG PO TBDP
ORAL_TABLET | ORAL | Status: AC
  Filled 2016-07-28: qty 1

## 2016-07-28 MED ORDER — DOXYCYCLINE HYCLATE 100 MG PO CAPS: 100.0000 mg | ORAL_CAPSULE | Freq: Two times a day (BID) | ORAL | 0 refills | Status: DC

## 2016-07-28 MED ORDER — HYDROCODONE-ACETAMINOPHEN 5-325 MG PO TABS: 1.0000 | ORAL_TABLET | ORAL | 0 refills | Status: DC | PRN

## 2016-07-28 MED ORDER — ONDANSETRON 4 MG PO TBDP
4.0000 mg | ORAL_TABLET | Freq: Once | ORAL | Status: AC
  Administered 2016-07-28: 4 mg via ORAL

## 2016-07-28 MED ORDER — HYDROCODONE-ACETAMINOPHEN 5-325 MG PO TABS
ORAL_TABLET | ORAL | Status: AC
  Filled 2016-07-28: qty 2

## 2016-07-28 NOTE — ED Triage Notes (Signed)
yest     Woke  Up  With  Soreness      In  l  Knee     No  recent  Injury    PAIN NOTED  ON  WEIGHT  BEARING

## 2016-07-28 NOTE — Discharge Instructions (Signed)
You have a possible ruptured bursa in your left knee. I have ordered for an MRI to be done at your convenience sometime this week. Results will be forwarded to your primary care provider. Bacterial growth and other causes of effusion and pain. He will be notified of these results as well. Your CBC was within normal limits, with the exception of hematocrit was slightly depressed. I'm starting you on doxycycline, 1 tab twice a day to cover for possible infection, based on the appearance of the synovial fluid.  For pain I prescribed ibuprofen 800 mg, take one tablet every 8 hours around-the-clock. For breakthrough pain, prescribed acetaminophen with hydrocodone, take 1 tab every 4-6 hours as needed for pain. This medicine is a narcotic, it is addictive, do not take more than what is necessary, do not drink any alcohol or drive taking this medicine. Keep your knee elevated, apply compression to either Ace bandages or knee sleeve, rest, and apply ice 15 minutes at a time up to 4 times a day

## 2016-07-28 NOTE — ED Provider Notes (Signed)
CSN: [id number]     Arrival date & time 07/28/16  1250 History   First MD Initiated Contact with Patient 07/28/16 1427     Chief Complaint  Patient presents with  . Leg Injury   (Consider location/radiation/quality/duration/timing/severity/associated sxs/prior Treatment) 26 year old male patient presents to clinic with left knee pain. States his pain occurred suddenly, starting yesterday evening gradually worsening overnight. He denies any traumatic cause, he has not fallen, not twisted his knee, has not lifted any heavy objects, he has not been involved in any MVC's. States his joint is quite painful, is difficulty walking on it, difficulty bearing weight. He has no prior history of any joint disorders, however he reveals an history he has had 3 previous types of cancer, states he's had a neuroblastoma, he reports having had breast cancer, and a basal cell carcinoma.   The history is provided by the patient.    Past Medical History:  Diagnosis Date  . 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 Status post autologous bone marrow transplant [facility])    Past Surgical History:  Procedure Laterality Date  . ADRENALECTOMY     Family History  Problem Relation Age of Onset  . Cancer Maternal Grandfather 66    colorectal  . Cancer Other 25    mat great uncle (through [facility]) with possible neuroblastoma  . Cancer Other 44    male mat first cousin once removed with breast cancer (related through MGF's sister)   Social History  Substance Use Topics  . Smoking status: Never Smoker  . Smokeless tobacco: Not on file  . Alcohol use No    Review of Systems  Reason unable to perform ROS: as covered in HPI.  All other systems reviewed and are negative.   Allergies  Bactrim [sulfamethoxazole-trimethoprim]  Home Medications   Prior to Admission medications   Medication Sig Start Date End Date Taking? Authorizing Provider  doxycycline (VIBRAMYCIN) 100 MG capsule Take 1 capsule (100 mg total)  by mouth 2 (two) times daily. 07/28/16   Barnet Glasgow, NP  HYDROcodone-acetaminophen (NORCO/VICODIN) 5-325 MG tablet Take 1-2 tablets by mouth every 4 (four) hours as needed. 07/28/16   Barnet Glasgow, NP  ibuprofen (ADVIL,MOTRIN) 800 MG tablet Take 1 tablet (800 mg total) by mouth 3 (three) times daily. 07/28/16 08/11/16  Barnet Glasgow, NP   Meds Ordered and Administered this Visit   Medications  HYDROcodone-acetaminophen (NORCO/VICODIN) 5-325 MG per tablet 2 tablet (2 tablets Oral Given 07/28/16 1530)  ondansetron (ZOFRAN-ODT) disintegrating tablet 4 mg (4 mg Oral Given 07/28/16 1530)    BP 128/78 (BP Location: Left Arm)   Pulse 103   Temp 99.8 F (37.7 C) (Oral)   Resp 16   SpO2 100%  No data found.   Physical Exam  Constitutional: He is oriented to person, place, and time. He appears well-developed and well-nourished. No distress.  HENT:  Head: Normocephalic and atraumatic.  Right Ear: External ear normal.  Left Ear: External ear normal.  Cardiovascular: Normal rate and regular rhythm.   Pulmonary/Chest: Effort normal and breath sounds normal.  Musculoskeletal:       Left knee: He exhibits decreased range of motion, swelling, effusion, erythema and LCL laxity. He exhibits no deformity and no laceration. Tenderness found. Lateral joint line and LCL tenderness noted.  Neurological: He is alert and oriented to person, place, and time.  Skin: Skin is warm and dry. Capillary refill takes less than 2 seconds. He is not diaphoretic.  Psychiatric: He has a  normal mood and affect.  Nursing note and vitals reviewed.   Urgent Care Course     .Joint Aspiration/Arthrocentesis Date/Time: 07/28/2016 5:01 PM Performed by: Barnet Glasgow Authorized by: Sherlene Shams   Consent:    Consent obtained:  Verbal   Consent given by:  Patient   Risks discussed:  Bleeding, incomplete drainage, infection, nerve damage and pain   Alternatives discussed:  No treatment Location:    Location:   Knee   Knee:  L knee Anesthesia (see MAR for exact dosages):    Anesthesia method:  Topical application Procedure details:    Preparation: Patient was prepped and draped in usual sterile fashion     Needle gauge:  18 G   Ultrasound guidance: no     Approach:  Lateral   Aspirate amount:  20 cc   Aspirate characteristics:  Blood-tinged, bloody and cloudy   Steroid injected: no     Specimen collected: yes   Post-procedure details:    Dressing:  Adhesive bandage   Patient tolerance of procedure:  Tolerated well, no immediate complications Comments:     Sample sent for culture    (including critical care time)  Labs Review Labs Reviewed  SYNOVIAL CELL COUNT + DIFF, W/ CRYSTALS - Abnormal; Notable for the following:       Result Value   Color, Synovial RED (*)    Appearance-Synovial TURBID (*)    WBC, Synovial 4,140 (*)    Neutrophil, Synovial 84 (*)    Monocyte-Macrophage-Synovial Fluid 15 (*)    All other components within normal limits  CBC WITH DIFFERENTIAL/PLATELET - Abnormal; Notable for the following:    HCT 37.5 (*)    Monocytes Absolute 1.2 (*)    All other components within normal limits  BODY FLUID CULTURE    Imaging Review Dg Knee Complete 4 Views Left  Result Date: 07/28/2016 CLINICAL DATA:  Acute left knee pain.  No specific injury. EXAM: LEFT KNEE - COMPLETE 4+ VIEW COMPARISON:  None. FINDINGS: The joint spaces are maintained. No acute bony findings or osteochondral lesion. There is a large joint effusion. There is a large irregular osteochondroma involving the lateral aspect of the tibial metaphysis and involving the tibiofibular joint. Possible ruptured adventitial bursa. MRI may be helpful for further evaluation of this lesion and the large joint effusion. IMPRESSION: No acute bony findings. Large knee joint effusion Large irregular osteochondroma involving the lateral tibial metaphysis and also involving the tibiofibular joint. MRI may be helpful for further  evaluation. Electronically Signed   By: Marijo Sanes M.D.   On: 07/28/2016 15:36     Visual Acuity Review    MDM   1. Acute pain of left knee   2. Knee effusion, left      You have a possible ruptured bursa in your left knee. I have ordered for an MRI to be done at your convenience sometime this week. Results will be forwarded to your primary care provider. Bacterial growth and other causes of effusion and pain. He will be notified of these results as well. Your CBC was within normal limits, with the exception of hematocrit was slightly depressed. I'm starting you on doxycycline, 1 tab twice a day to cover for possible infection, based on the appearance of the synovial fluid.  For pain I prescribed ibuprofen 800 mg, take one tablet every 8 hours around-the-clock. For breakthrough pain, prescribed acetaminophen with hydrocodone, take 1 tab every 4-6 hours as needed for pain. This medicine is a  narcotic, it is addictive, do not take more than what is necessary, do not drink any alcohol or drive taking this medicine. Keep your knee elevated, apply compression to either Ace bandages or knee sleeve, rest, and apply ice 15 minutes at a time up to 4 times a day    [place] controlled substances reporting system consulted. One Rx for controlled substances in previous 6 months. Hydrocodone/APAP5/325 #11 on 07/01/2016     Barnet Glasgow, NP 07/28/16 2114

## 2016-07-29 LAB — SYNOVIAL CELL COUNT + DIFF, W/ CRYSTALS
Crystals, Fluid: NONE SEEN
EOSINOPHILS-SYNOVIAL: 0 % (ref 0–1)
Lymphocytes-Synovial Fld: 1 % (ref 0–20)
MONOCYTE-MACROPHAGE-SYNOVIAL FLUID: 15 % — AB (ref 50–90)
NEUTROPHIL, SYNOVIAL: 84 % — AB (ref 0–25)
WBC, SYNOVIAL: 41400 /mm3 — AB (ref 0–200)

## 2016-07-31 ENCOUNTER — Telehealth (HOSPITAL_COMMUNITY): Payer: Self-pay | Admitting: Emergency Medicine

## 2016-07-31 LAB — BODY FLUID CULTURE: Culture: NO GROWTH

## 2016-07-31 NOTE — Telephone Encounter (Signed)
Rec'd call from [place] at [place] [phone])  Philip Marquez is needing PA for MRI schedule tomorrow 08/01/16  Called [facility] and got PA  PA #: [id number]  Valid from 07/31/16 - 08/29/16  [name] and notified her.

## 2016-08-01 ENCOUNTER — Ambulatory Visit
Admission: RE | Admit: 2016-08-01 | Discharge: 2016-08-01 | Disposition: A | Discharge status: Home / Self Care | Payer: BLUE CROSS/BLUE SHIELD | Source: Ambulatory Visit | Attending: Emergency Medicine | Admitting: Emergency Medicine

## 2016-08-01 DIAGNOSIS — M25562 Pain in left knee: Secondary | ICD-10-CM

## 2016-08-01 MED ORDER — GADOBENATE DIMEGLUMINE 529 MG/ML IV SOLN
10.0000 mL | Freq: Once | INTRAVENOUS | Status: AC | PRN
  Administered 2016-08-01: 10 mL via INTRAVENOUS

## 2016-08-06 ENCOUNTER — Telehealth (HOSPITAL_COMMUNITY): Payer: Self-pay | Admitting: Emergency Medicine

## 2016-08-06 NOTE — Telephone Encounter (Signed)
-----   Message from Barnet Glasgow, NP sent at 08/01/2016  8:31 PM EST ----- Regarding: MRI Results  These are the results of Philip Marquez MRI today. Can we assist with getting these results to his primary care provider and route him to the next appropriate point of care for management?  IMPRESSION: 1. Severe nodular synovitis involving medial patellofemoral joint and extending inferiorly along the anterior aspect of the medial femoral condyles into Hoffa's fat.   Electronically Signed   By: Kathreen Devoid   On: 08/01/2016 11:54

## 2016-08-06 NOTE — Telephone Encounter (Signed)
Info has been faxed to patients PCP, Dr. Johney Frame at: Talmage Nap MD (Attending) [phone] (Work) [phone] (Fax) [place] [place], [place] [address]   Pt also states he has given CD w/images to PCP and has appt on 08/08/16  Adv pt to keep appt... Pt verb understanding.

## 2017-12-31 IMAGING — MR MR KNEE*L* WO/W CM
4 of 8 series · 18 of 40 positions shown · IV contrast (multihance)
Comparison: None.

CLINICAL DATA: Left knee pain.  Pain and swelling for 5 days.

EXAM:
MRI OF THE LEFT KNEE WITHOUT AND WITH CONTRAST
TECHNIQUE: Multiplanar, multisequence MR imaging of the knee was performed
before and after the administration of intravenous contrast.
CONTRAST:  10mL MULTIHANCE GADOBENATE DIMEGLUMINE 529 MG/ML IV SOLN

[Series 3: PD fat-sat · axial · 3.5mm · 0.31mm/px · z∈[-113,+13]mm · 5 of 31 slices shown (1 of 3)]
[im 1/31]
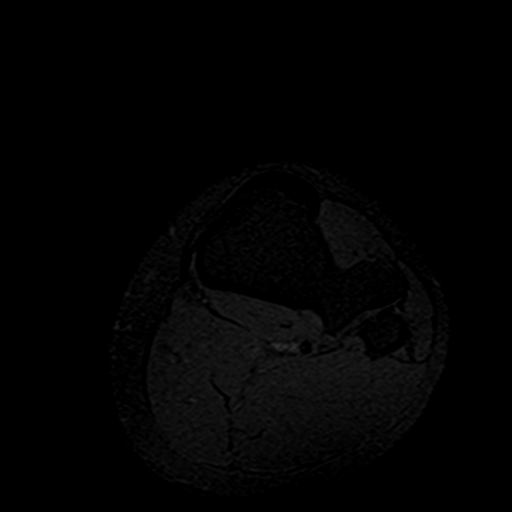
[im 8/31]
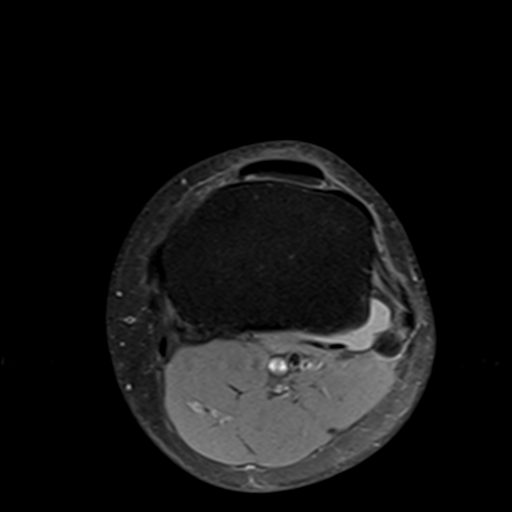
[im 16/31]
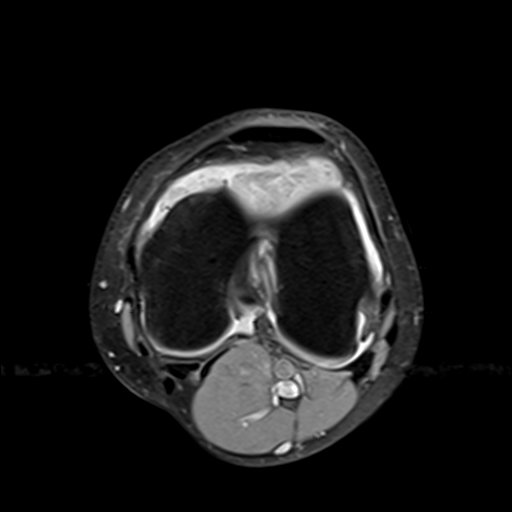
[im 23/31]
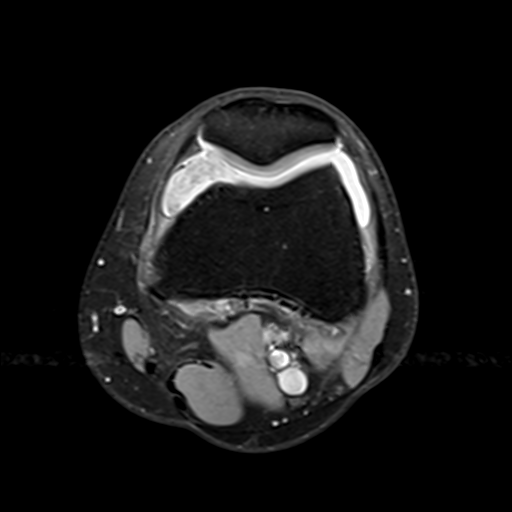
[im 31/31]
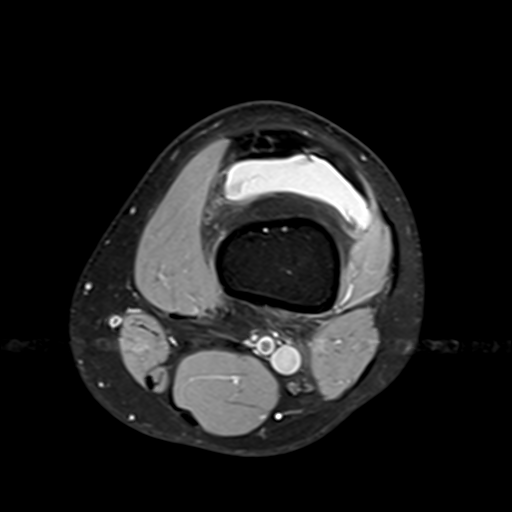

[Series 4: PD fat-sat · coronal · 3.5mm · 0.29mm/px · 4 of 25 slices shown (2 of 3)]
[im 1/25]
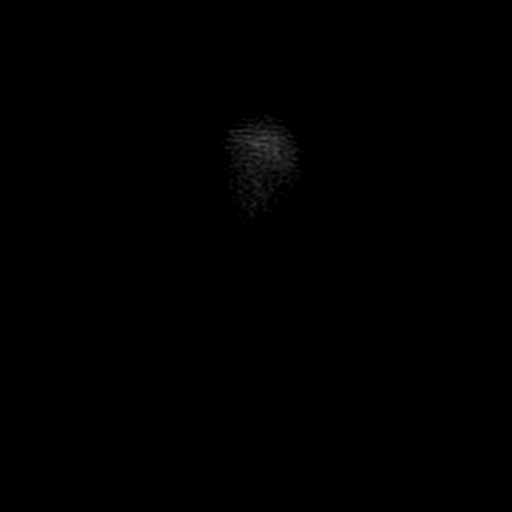
[im 9/25]
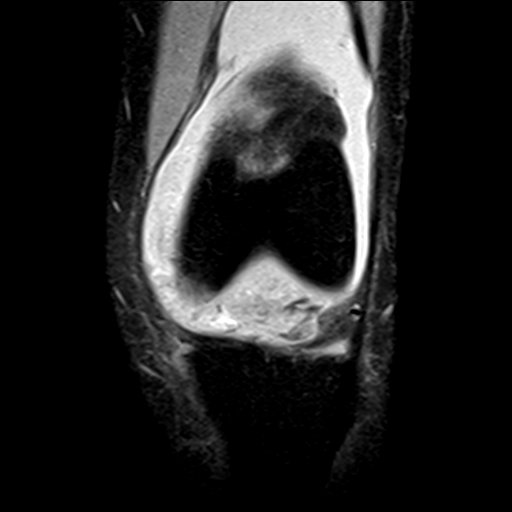
[im 17/25]
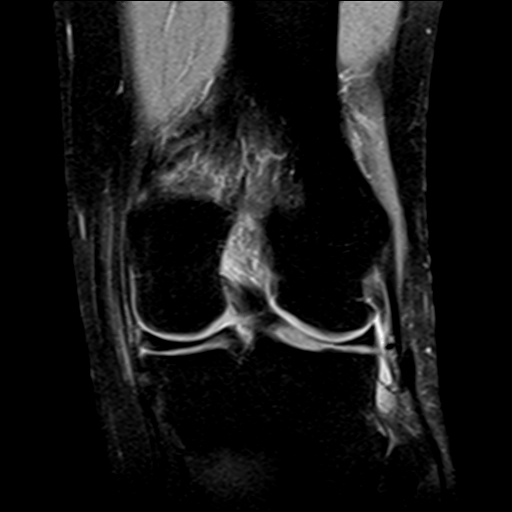
[im 25/25]
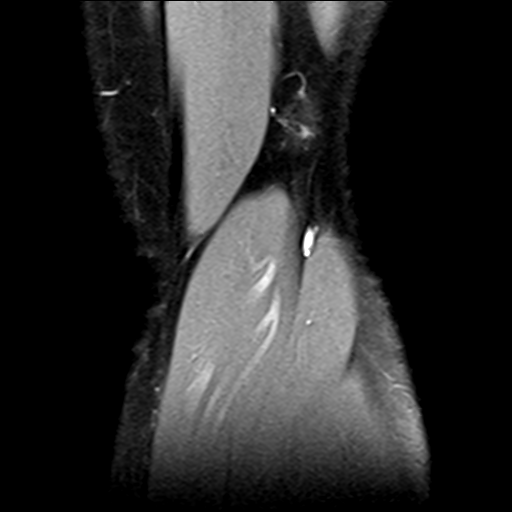

[Series 5: T2 fat-sat · coronal · 3.5mm · 0.29mm/px · 4 of 25 slices shown]
[im 1/25]
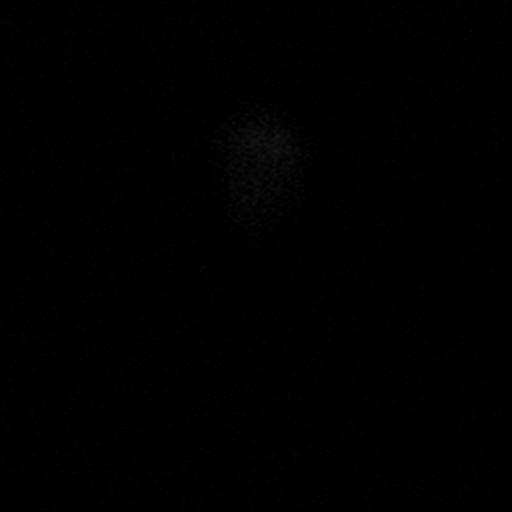
[im 9/25]
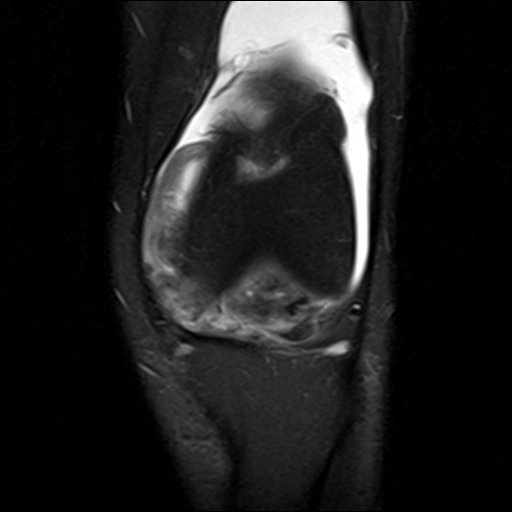
[im 17/25]
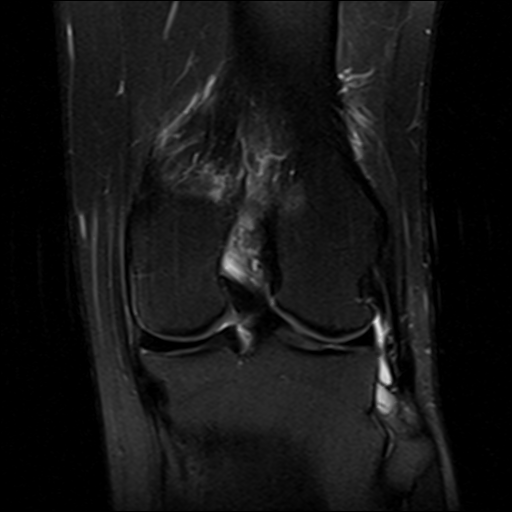
[im 25/25]
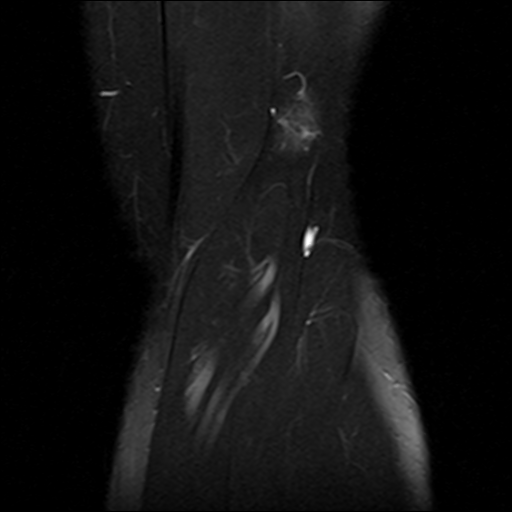

[Series 7: PD fat-sat · sagittal · 3.2mm · 0.29mm/px · 5 of 29 slices shown (3 of 3)]
[im 1/29]
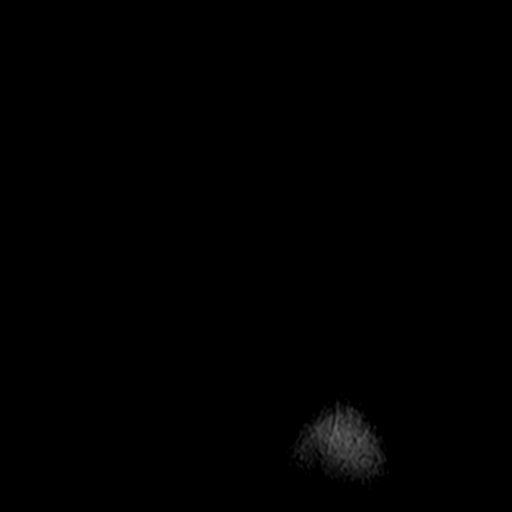
[im 8/29]
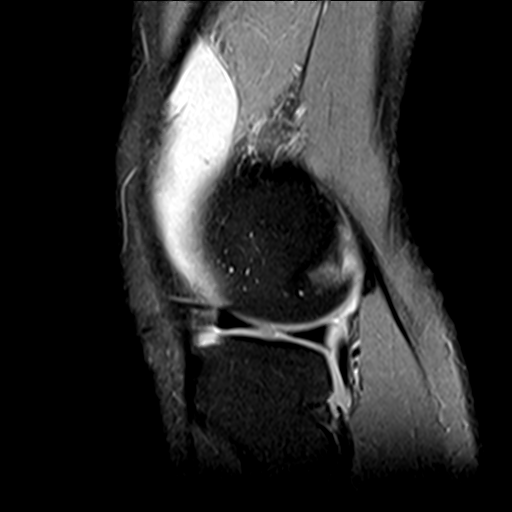
[im 15/29]
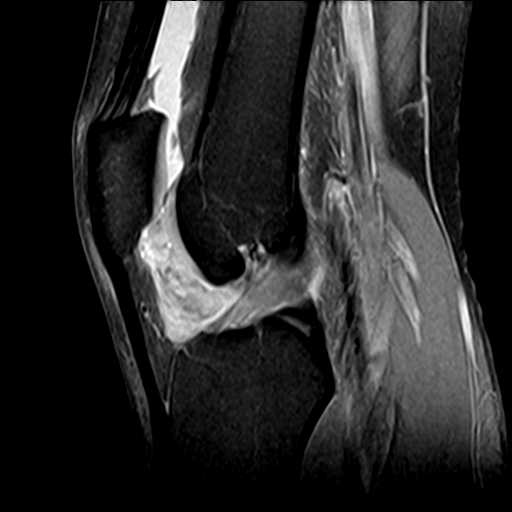
[im 22/29]
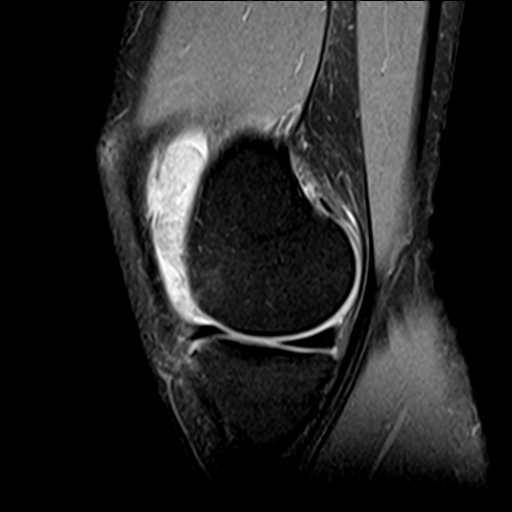
[im 29/29]
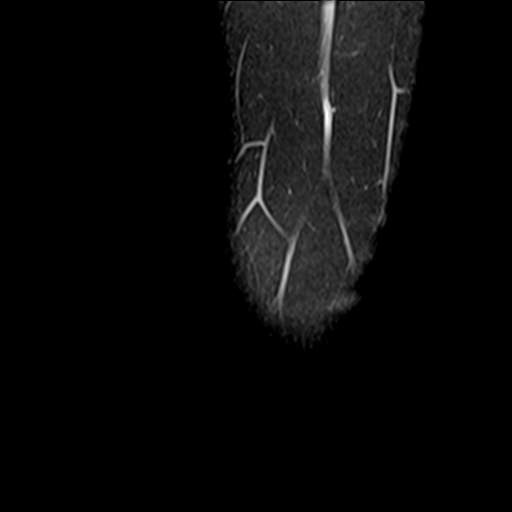

[18 of 40 positions shown; findings below may reference images not displayed]

FINDINGS: MENISCI

Medial meniscus:  Intact.

Lateral meniscus:  Intact.

LIGAMENTS

Cruciates:  Intact ACL and PCL.

Collaterals: Medial collateral ligament is intact. Lateral
collateral ligament complex is intact.

CARTILAGE

Patellofemoral:  No chondral defect.

Medial:  No chondral defect.

Lateral: Partial-thickness cartilage loss of the lateral tibial
plateau.

Joint: Large joint effusion. No plical thickening. Severe nodular
synovitis involving medial patellofemoral joint and extending
inferiorly along the anterior aspect of the medial femoral condyles
into Hoffa's fat.

Popliteal Fossa:  No Baker cyst.  Intact popliteus tendon.

Extensor Mechanism: Intact quadriceps tendon and patellar tendon.
Intact medial and lateral patellar retinaculum. Intact MPFL.

Bones:  No marrow signal abnormality.  No fracture dislocation.

Other: No fluid collection or hematoma.
IMPRESSION: 1. Severe nodular synovitis involving medial patellofemoral joint
and extending inferiorly along the anterior aspect of the medial
femoral condyles into Hoffa's fat.

## 2018-03-07 ENCOUNTER — Emergency Department (HOSPITAL_BASED_OUTPATIENT_CLINIC_OR_DEPARTMENT_OTHER): Payer: BLUE CROSS/BLUE SHIELD

## 2018-03-07 ENCOUNTER — Encounter (HOSPITAL_BASED_OUTPATIENT_CLINIC_OR_DEPARTMENT_OTHER): Payer: Self-pay

## 2018-03-07 ENCOUNTER — Other Ambulatory Visit: Payer: Self-pay

## 2018-03-07 ENCOUNTER — Emergency Department (HOSPITAL_BASED_OUTPATIENT_CLINIC_OR_DEPARTMENT_OTHER)
Admission: EM | Admit: 2018-03-07 | Discharge: 2018-03-07 | Disposition: A | Discharge status: Home / Self Care | Payer: BLUE CROSS/BLUE SHIELD | Attending: Emergency Medicine | Admitting: Emergency Medicine

## 2018-03-07 DIAGNOSIS — S43401A Unspecified sprain of right shoulder joint, initial encounter: Secondary | ICD-10-CM | POA: Diagnosis not present

## 2018-03-07 DIAGNOSIS — F1210 Cannabis abuse, uncomplicated: Secondary | ICD-10-CM | POA: Diagnosis not present

## 2018-03-07 DIAGNOSIS — Z85828 Personal history of other malignant neoplasm of skin: Secondary | ICD-10-CM | POA: Diagnosis not present

## 2018-03-07 DIAGNOSIS — Z853 Personal history of malignant neoplasm of breast: Secondary | ICD-10-CM | POA: Insufficient documentation

## 2018-03-07 DIAGNOSIS — Y998 Other external cause status: Secondary | ICD-10-CM | POA: Diagnosis not present

## 2018-03-07 DIAGNOSIS — Z85858 Personal history of malignant neoplasm of other endocrine glands: Secondary | ICD-10-CM | POA: Diagnosis not present

## 2018-03-07 DIAGNOSIS — Y9389 Activity, other specified: Secondary | ICD-10-CM | POA: Insufficient documentation

## 2018-03-07 DIAGNOSIS — S4991XA Unspecified injury of right shoulder and upper arm, initial encounter: Secondary | ICD-10-CM | POA: Diagnosis present

## 2018-03-07 DIAGNOSIS — Y929 Unspecified place or not applicable: Secondary | ICD-10-CM | POA: Insufficient documentation

## 2018-03-07 DIAGNOSIS — W01198A Fall on same level from slipping, tripping and stumbling with subsequent striking against other object, initial encounter: Secondary | ICD-10-CM | POA: Diagnosis not present

## 2018-03-07 HISTORY — DX: Malignant neoplasm of unspecified site of unspecified female breast: C50.919

## 2018-03-07 HISTORY — DX: Unspecified malignant neoplasm of skin, unspecified: C44.90

## 2018-03-07 MED ORDER — IBUPROFEN 800 MG PO TABS
800.0000 mg | ORAL_TABLET | Freq: Once | ORAL | Status: AC
  Administered 2018-03-07: 800 mg via ORAL
  Filled 2018-03-07: qty 1

## 2018-03-07 MED ORDER — IBUPROFEN 600 MG PO TABS: 600.0000 mg | ORAL_TABLET | Freq: Four times a day (QID) | ORAL | 0 refills | Status: DC | PRN

## 2018-03-07 NOTE — ED Provider Notes (Signed)
[facility] EMERGENCY DEPARTMENT Provider Note   CSN: [id number] Arrival date & time: 03/07/18  1914     History   Chief Complaint Chief Complaint  Patient presents with  . Fall    HPI Philip Marquez is a 27 y.o. male.  The history is provided by the patient. No language interpreter was used.  Fall     27 year old male with reported history of neuroblastoma, breast cancer and skin cancer presenting for evaluation of a recent fall.  Patient report a few hours ago, he was putting air into his tires and accidentally stepped on an uncovered man hold, patient fell to his right side and striking his shoulder against the ground.  He felt something pop follows with immediate pain to the right shoulder.  Pain is sharp, nonradiating, at rest it is 2 out of 10, with movement it is 6 out of 10.  He denies hitting his head or loss of consciousness.  He did not take anything for the pain.  He is here 1 to make sure nothing is broken.  He denies any significant neck pain, elbow pain, wrist pain, numbness or weakness.  Past Medical History:  Diagnosis Date  . Breast cancer ([place])   . 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 Skin cancer   . Status post autologous bone marrow transplant [facility])     Patient Active Problem List   Diagnosis Date Noted  . 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 Family history of malignant neoplasm of gastrointestinal tract 12/28/2013  . Family history of malignant neoplasm of breast 12/28/2013  . Breast cancer of lower-outer quadrant of right male breast ([place]) 12/24/2013    Past Surgical History:  Procedure Laterality Date  . ADRENALECTOMY    . MASTECTOMY          Home Medications    Prior to Admission medications   Medication Sig Start Date End Date Taking? Authorizing Provider  doxycycline (VIBRAMYCIN) 100 MG capsule Take 1 capsule (100 mg total) by mouth 2 (two) times daily. 07/28/16   Barnet Glasgow, NP    HYDROcodone-acetaminophen (NORCO/VICODIN) 5-325 MG tablet Take 1-2 tablets by mouth every 4 (four) hours as needed. 07/28/16   Barnet Glasgow, NP    Family History Family History  Problem Relation Age of Onset  . Cancer Maternal Grandfather 77       colorectal  . Cancer Other 25       mat great uncle (through [facility]) with possible neuroblastoma  . Cancer Other 33       male mat first cousin once removed with breast cancer (related through MGF's sister)    Social History Social History   Tobacco Use  . Smoking status: Never Smoker  . Smokeless tobacco: Never Used  Substance Use Topics  . Alcohol use: Yes    Comment: occ  . Drug use: Yes    Types: Marijuana     Allergies   Bactrim [sulfamethoxazole-trimethoprim]   Review of Systems Review of Systems  Constitutional: Negative for fever.  Musculoskeletal: Positive for arthralgias.  Skin: Negative for wound.  Neurological: Negative for numbness.     Physical Exam Updated Vital Signs BP 134/88 (BP Location: Left Arm)   Pulse 76   Temp 98.8 F (37.1 C) (Oral)   Resp 18   Ht 5\' 7"  (1.702 m)   Wt 54.4 kg   SpO2 100%   BMI 18.79 kg/m   Physical Exam  Constitutional: He appears well-developed and  well-nourished. No distress.  HENT:  Head: Atraumatic.  Eyes: Conjunctivae are normal.  Neck: Neck supple.  Musculoskeletal: He exhibits tenderness (Right shoulder: Tenderness along the posterior shoulder, as well as around the lateral deltoid on palpation.  No evidence of dislocation or deformity.  Shoulder with decreased flexion and rotation secondary to pain.).  No tenderness to cervical spine, no tenderness to right elbow or right wrist.  Neurological: He is alert.  Skin: No rash noted.  Psychiatric: He has a normal mood and affect.  Nursing note and vitals reviewed.    ED Treatments / Results  Labs (all labs ordered are listed, but only abnormal results are displayed) Labs Reviewed - No data to  display  EKG None  Radiology Dg Shoulder Right  Result Date: 03/07/2018 CLINICAL DATA:  Patient fell into an uncovered manhole 1 hour ago with right shoulder pain and decreased range of motion. EXAM: RIGHT SHOULDER - 2+ VIEW COMPARISON:  None. FINDINGS: There is no evidence of fracture or dislocation. There is no evidence of arthropathy or other focal bone abnormality. Soft tissues are unremarkable. Surgical axillary clips are seen. The adjacent ribs and lung are nonacute. IMPRESSION: No acute fracture or malalignment of the right shoulder. Electronically Signed   By: Ashley Royalty M.D.   On: 03/07/2018 20:51    Procedures Procedures (including critical care time)  Medications Ordered in ED Medications  ibuprofen (ADVIL,MOTRIN) tablet 800 mg (800 mg Oral Given 03/07/18 2006)     Initial Impression / Assessment and Plan / ED Course  I have reviewed the triage vital signs and the nursing notes.  Pertinent labs & imaging results that were available during my care of the patient were reviewed by me and considered in my medical decision making (see chart for details).     BP 134/88 (BP Location: Left Arm)   Pulse 76   Temp 98.8 F (37.1 C) (Oral)   Resp 18   Ht 5\' 7"  (1.702 m)   Wt 54.4 kg   SpO2 100%   BMI 18.79 kg/m    Final Clinical Impressions(s) / ED Diagnoses   Final diagnoses:  Sprain of right shoulder, unspecified shoulder sprain type, initial encounter    ED Discharge Orders         Ordered    ibuprofen (ADVIL,MOTRIN) 600 MG tablet  Every 6 hours PRN     03/07/18 2055         8:02 PM Patient had a mechanical fall and landed on his right shoulder.  He does have tenderness to the lateral deltoid and posterior shoulder at the scapula.  Will obtain x-ray to rule out acute fracture  8:55 PM X-ray of the right shoulder without acute fracture or malalignment.  Patient diagnosed with shoulder sprain, sling provided for support.  [place]ice therapy discussed.   Domenic Moras, PA-C 03/07/18 2056    Quintella Reichert, MD 03/08/18 0140

## 2018-03-07 NOTE — ED Triage Notes (Signed)
Pt states he tripped/fell ~630-pain to right shoulder-NAD-steady gait

## 2018-12-29 ENCOUNTER — Other Ambulatory Visit: Payer: Self-pay

## 2018-12-29 DIAGNOSIS — Z20822 Contact with and (suspected) exposure to covid-19: Secondary | ICD-10-CM

## 2018-12-31 LAB — NOVEL CORONAVIRUS, NAA: SARS-CoV-2, NAA: NOT DETECTED

## 2019-08-21 ENCOUNTER — Ambulatory Visit: Payer: Self-pay | Attending: Internal Medicine

## 2019-08-21 DIAGNOSIS — Z23 Encounter for immunization: Secondary | ICD-10-CM

## 2019-08-21 NOTE — Progress Notes (Signed)
   Covid-19 Vaccination Clinic  Name:  Philip Marquez    MRN: [id number] DOB: 06/08/1990  08/21/2019  [name] was observed post Covid-19 immunization for 15 minutes without incident. He was provided with Vaccine Information Sheet and instruction to access the V-Safe system.   [name] was instructed to call 911 with any severe reactions post vaccine: Marland Kitchen Difficulty breathing  . Swelling of face and throat  . A fast heartbeat  . A bad rash all over body  . Dizziness and weakness   Immunizations Administered    Name Date Dose VIS Date Route   Moderna COVID-19 Vaccine 08/21/2019 11:48 AM 0.5 mL 04/28/2019 Intramuscular   Manufacturer: Moderna   Lot: [id number]   [id number]

## 2019-09-22 ENCOUNTER — Ambulatory Visit: Payer: Self-pay | Attending: Internal Medicine

## 2019-09-22 DIAGNOSIS — Z23 Encounter for immunization: Secondary | ICD-10-CM

## 2019-09-22 NOTE — Progress Notes (Signed)
   Covid-19 Vaccination Clinic  Name:  Philip Marquez    MRN: [id number] DOB: [birth year]  09/22/2019  [name] was observed post Covid-19 immunization for 15 minutes without incident. He was provided with Vaccine Information Sheet and instruction to access the V-Safe system.   [name] was instructed to call 911 with any severe reactions post vaccine: Marland Kitchen Difficulty breathing  . Swelling of face and throat  . A fast heartbeat  . A bad rash all over body  . Dizziness and weakness   Immunizations Administered    Name Date Dose VIS Date Route   Moderna COVID-19 Vaccine 09/22/2019 12:21 PM 0.5 mL 04/2019 Intramuscular   Manufacturer: Moderna   Lot: [id number]   [id number]

## 2020-01-07 ENCOUNTER — Other Ambulatory Visit: Payer: Self-pay

## 2020-01-07 ENCOUNTER — Ambulatory Visit (HOSPITAL_COMMUNITY)
Admission: EM | Admit: 2020-01-07 | Discharge: 2020-01-07 | Disposition: A | Discharge status: Home / Self Care | Payer: Self-pay | Attending: Family Medicine | Admitting: Family Medicine

## 2020-01-07 ENCOUNTER — Encounter (HOSPITAL_COMMUNITY): Payer: Self-pay

## 2020-01-07 ENCOUNTER — Ambulatory Visit (INDEPENDENT_AMBULATORY_CARE_PROVIDER_SITE_OTHER): Payer: Self-pay

## 2020-01-07 DIAGNOSIS — M25561 Pain in right knee: Secondary | ICD-10-CM

## 2020-01-07 DIAGNOSIS — M65969 Unspecified synovitis and tenosynovitis, unspecified lower leg: Secondary | ICD-10-CM

## 2020-01-07 DIAGNOSIS — M25562 Pain in left knee: Secondary | ICD-10-CM

## 2020-01-07 DIAGNOSIS — M659 Synovitis and tenosynovitis, unspecified: Secondary | ICD-10-CM

## 2020-01-07 MED ORDER — PREDNISONE 10 MG PO TABS
ORAL_TABLET | ORAL | 0 refills | Status: DC
Start: 2020-01-07 — End: 2020-04-01

## 2020-01-07 MED ORDER — NAPROXEN 500 MG PO TABS
500.0000 mg | ORAL_TABLET | Freq: Two times a day (BID) | ORAL | 0 refills | Status: DC
Start: 2020-01-07 — End: 2020-04-01

## 2020-01-07 NOTE — ED Provider Notes (Addendum)
[place]    CSN: [id number] Arrival date & time: 01/07/20  1313      History   Chief Complaint Chief Complaint  Patient presents with  . Knee Pain    HPI Philip Marquez is a 29 y.o. male history of breast cancer, neuroblastoma and known benign tumor on spine presenting today for evaluation of left knee pain and swelling.  Patient reports that he has had progressively worsening pain and swelling to his left knee since earlier this week.  Today felt as if he could not bend or straighten his knee due to amount of swelling.  Denies any new injury or trauma.  Does report increased standing of recently with a new job.  Has history of similar in 2018 and was noted to have multinodular synovitis on MRI.  Has not take any medicines for symptoms.  HPI  Past Medical History:  Diagnosis Date  . Breast cancer ([place])   . Neuroblastoma of adrenal gland ([place])   . Skin cancer   . Status post autologous bone marrow transplant [facility])     Patient Active Problem List   Diagnosis Date Noted  . 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 Family history of malignant neoplasm of gastrointestinal tract 12/28/2013  . Family history of malignant neoplasm of breast 12/28/2013  . Breast cancer of lower-outer quadrant of right male breast ([place]) 12/24/2013    Past Surgical History:  Procedure Laterality Date  . ADRENALECTOMY    . MASTECTOMY         Home Medications    Prior to Admission medications   Medication Sig Start Date End Date Taking? Authorizing Provider  naproxen (NAPROSYN) 500 MG tablet Take 1 tablet (500 mg total) by mouth 2 (two) times daily. 01/07/20   Nyiah Pianka C, PA-C  predniSONE (DELTASONE) 10 MG tablet Begin with 6 tabs on day 1, 5 tab on day 2, 4 tab on day 3, 3 tab on day 4, 2 tab on day 5, 1 tab on day 6-take with food 01/07/20   Antino Mayabb, [place] C, PA-C    Family History Family History  Problem Relation Age of Onset  . Cancer Maternal Grandfather  57       colorectal  . Cancer Other 25       mat great uncle (through [facility]) with possible neuroblastoma  . Cancer Other 38       male mat first cousin once removed with breast cancer (related through MGF's sister)    Social History Social History   Tobacco Use  . Smoking status: Never Smoker  . Smokeless tobacco: Never Used  Substance Use Topics  . Alcohol use: Yes    Comment: occ  . Drug use: Yes    Types: Marijuana     Allergies   Bactrim [sulfamethoxazole-trimethoprim]   Review of Systems Review of Systems  Constitutional: Negative for fatigue and fever.  Eyes: Negative for redness, itching and visual disturbance.  Respiratory: Negative for shortness of breath.   Cardiovascular: Negative for chest pain and leg swelling.  Gastrointestinal: Negative for nausea and vomiting.  Musculoskeletal: Positive for arthralgias, gait problem and joint swelling. Negative for myalgias.  Skin: Negative for color change, rash and wound.  Neurological: Negative for dizziness, syncope, weakness, light-headedness and headaches.     Physical Exam Triage Vital Signs ED Triage Vitals  Enc Vitals Group     BP 01/07/20 1357 121/77     Pulse Rate 01/07/20 1357 81  Resp 01/07/20 1357 18     Temp 01/07/20 1357 98.5 F (36.9 C)     Temp Source 01/07/20 1357 Oral     SpO2 01/07/20 1357 100 %     Weight --      Height --      Head Circumference --      Peak Flow --      Pain Score 01/07/20 1356 7     Pain Loc --      Pain Edu? --      Excl. in [place]? --    No data found.  Updated Vital Signs BP 121/77 (BP Location: Right Arm)   Pulse 81   Temp 98.5 F (36.9 C) (Oral)   Resp 18   SpO2 100%   Visual Acuity Right Eye Distance:   Left Eye Distance:   Bilateral Distance:    Right Eye Near:   Left Eye Near:    Bilateral Near:     Physical Exam Vitals and nursing note reviewed.  Constitutional:      Appearance: He is well-developed.     Comments: No acute distress    HENT:     Head: Normocephalic and atraumatic.     Nose: Nose normal.  Eyes:     Conjunctiva/sclera: Conjunctivae normal.  Cardiovascular:     Rate and Rhythm: Normal rate.  Pulmonary:     Effort: Pulmonary effort is normal. No respiratory distress.  Abdominal:     General: There is no distension.  Musculoskeletal:        General: Normal range of motion.     Cervical back: Neck supple.     Comments: Left knee: Moderate swelling noted diffusely about knee and suprapatellar area as well as infrapatellar area, nontender to palpation over patella, tender along medial joint line, limited flexion and extension due to pain and swelling No laxity appreciated with special testing  Skin:    General: Skin is warm and dry.  Neurological:     Mental Status: He is alert and oriented to person, place, and time.      UC Treatments / Results  Labs (all labs ordered are listed, but only abnormal results are displayed) Labs Reviewed - No data to display  EKG   Radiology DG Knee Complete 4 Views Left  Result Date: 01/07/2020 CLINICAL DATA:  LEFT knee pain for 1 week.  No injury. EXAM: LEFT KNEE - COMPLETE 4+ VIEW COMPARISON:  Plain film of the LEFT knee dated 07/28/2016. FINDINGS: Osseous alignment is normal. No fracture line or displaced fracture fragment. No acute or suspicious osseous lesion. Stable large osteochondroma exophytic to the lateral margin of the proximal tibia. Probable joint effusion. IMPRESSION: 1. Probable joint effusion. 2. No acute appearing osseous abnormality 3. Stable large osteochondroma exophytic to the lateral margin of the proximal tibia. Electronically Signed   By: Franki Cabot M.D.   On: 01/07/2020 15:25    Procedures Procedures (including critical care time)  Medications Ordered in UC Medications - No data to display  Initial Impression / Assessment and Plan / UC Course  I have reviewed the triage vital signs and the nursing notes.  Pertinent labs & imaging  results that were available during my care of the patient were reviewed by me and considered in my medical decision making (see chart for details).     X-ray stable from prior imaging with stable osteochondroma.  Suspect likely repeat synovitis/inflammation from increased standing up recently.  Placing on prednisone taper followed  by NSAIDs, Ace wrap and crutches for comfort and help with swelling, ice and elevate.  Monitor for gradual resolution.  No overlying erythema suggestive of septic joint or infection. Discussed strict return precautions. Patient verbalized understanding and is agreeable with plan.   Final Clinical Impressions(s) / UC Diagnoses   Final diagnoses:  Synovitis of knee  Acute pain of left knee     Discharge Instructions     Begin prednisone taper for the next 6 days-begin with 6 tabs/60 mg, decrease by 1 tablet each day until complete-6, 5, 4, 3, 2, 1-take with food and in the morning if you are able After completing course of prednisone may use Naprosyn twice daily as needed for pain/swelling Ice and elevate knee Ace wrap for compression to further help with swelling/inflammation Follow up if not improving or worsening    ED Prescriptions    Medication Sig Dispense Auth. Provider   predniSONE (DELTASONE) 10 MG tablet Begin with 6 tabs on day 1, 5 tab on day 2, 4 tab on day 3, 3 tab on day 4, 2 tab on day 5, 1 tab on day 6-take with food 21 tablet Tam Savoia C, PA-C   naproxen (NAPROSYN) 500 MG tablet Take 1 tablet (500 mg total) by mouth 2 (two) times daily. 30 tablet Sharnika Binney, [place] C, PA-C     PDMP not reviewed this encounter.   Janith Lima, PA-C 01/07/20 1535    Shauntee Karp, [place] C, PA-C 01/07/20 1610

## 2020-01-07 NOTE — Discharge Instructions (Addendum)
Begin prednisone taper for the next 6 days-begin with 6 tabs/60 mg, decrease by 1 tablet each day until complete-6, 5, 4, 3, 2, 1-take with food and in the morning if you are able After completing course of prednisone may use Naprosyn twice daily as needed for pain/swelling Ice and elevate knee Ace wrap for compression to further help with swelling/inflammation Follow up if not improving or worsening

## 2020-01-07 NOTE — ED Triage Notes (Signed)
Pt presents with recurrent left knee pain and swelling.

## 2020-03-28 ENCOUNTER — Ambulatory Visit: Payer: BC Managed Care – PPO | Admitting: Adult Health

## 2020-03-31 DIAGNOSIS — N6320 Unspecified lump in the left breast, unspecified quadrant: Secondary | ICD-10-CM | POA: Diagnosis not present

## 2020-03-31 DIAGNOSIS — Z9010 Acquired absence of unspecified breast and nipple: Secondary | ICD-10-CM | POA: Diagnosis not present

## 2020-03-31 DIAGNOSIS — R928 Other abnormal and inconclusive findings on diagnostic imaging of breast: Secondary | ICD-10-CM | POA: Diagnosis not present

## 2020-03-31 DIAGNOSIS — F419 Anxiety disorder, unspecified: Secondary | ICD-10-CM | POA: Diagnosis not present

## 2020-03-31 DIAGNOSIS — Z803 Family history of malignant neoplasm of breast: Secondary | ICD-10-CM | POA: Diagnosis not present

## 2020-03-31 DIAGNOSIS — R922 Inconclusive mammogram: Secondary | ICD-10-CM | POA: Diagnosis not present

## 2020-03-31 DIAGNOSIS — Z9289 Personal history of other medical treatment: Secondary | ICD-10-CM | POA: Diagnosis not present

## 2020-03-31 DIAGNOSIS — Z9481 Bone marrow transplant status: Secondary | ICD-10-CM | POA: Diagnosis not present

## 2020-03-31 DIAGNOSIS — Z923 Personal history of irradiation: Secondary | ICD-10-CM | POA: Diagnosis not present

## 2020-03-31 DIAGNOSIS — Z882 Allergy status to sulfonamides status: Secondary | ICD-10-CM | POA: Diagnosis not present

## 2020-04-01 ENCOUNTER — Other Ambulatory Visit: Payer: Self-pay

## 2020-04-01 ENCOUNTER — Ambulatory Visit (INDEPENDENT_AMBULATORY_CARE_PROVIDER_SITE_OTHER): Payer: BC Managed Care – PPO | Admitting: Adult Health

## 2020-04-01 ENCOUNTER — Encounter (HOSPITAL_COMMUNITY): Payer: Self-pay | Admitting: Emergency Medicine

## 2020-04-01 ENCOUNTER — Encounter: Payer: Self-pay | Admitting: Adult Health

## 2020-04-01 VITALS — BP 125/87 | HR 74 | Ht 67.0 in | Wt 120.0 lb

## 2020-04-01 DIAGNOSIS — F331 Major depressive disorder, recurrent, moderate: Secondary | ICD-10-CM

## 2020-04-01 DIAGNOSIS — R000 Tachycardia, unspecified: Secondary | ICD-10-CM | POA: Diagnosis not present

## 2020-04-01 DIAGNOSIS — Z853 Personal history of malignant neoplasm of breast: Secondary | ICD-10-CM | POA: Diagnosis not present

## 2020-04-01 DIAGNOSIS — F428 Other obsessive-compulsive disorder: Secondary | ICD-10-CM

## 2020-04-01 DIAGNOSIS — F411 Generalized anxiety disorder: Secondary | ICD-10-CM

## 2020-04-01 DIAGNOSIS — T887XXA Unspecified adverse effect of drug or medicament, initial encounter: Secondary | ICD-10-CM | POA: Diagnosis not present

## 2020-04-01 DIAGNOSIS — R110 Nausea: Secondary | ICD-10-CM | POA: Diagnosis not present

## 2020-04-01 DIAGNOSIS — I10 Essential (primary) hypertension: Secondary | ICD-10-CM | POA: Diagnosis not present

## 2020-04-01 DIAGNOSIS — F419 Anxiety disorder, unspecified: Secondary | ICD-10-CM | POA: Diagnosis not present

## 2020-04-01 DIAGNOSIS — R002 Palpitations: Secondary | ICD-10-CM | POA: Insufficient documentation

## 2020-04-01 DIAGNOSIS — G4700 Insomnia, unspecified: Secondary | ICD-10-CM | POA: Diagnosis not present

## 2020-04-01 MED ORDER — SERTRALINE HCL 50 MG PO TABS: 50.0000 mg | ORAL_TABLET | Freq: Every day | ORAL | 2 refills | Status: DC

## 2020-04-01 NOTE — Progress Notes (Signed)
[facility] MD/PA/NP Initial Note  04/01/2020 9:14 AM Philip Marquez  MRN:  [id number]  Chief Complaint:   HPI:  Describes mood today as "so-so". Pleasant. Flat. Mood symptoms - depression - up and down, anxiety - a lot of the time, and denies irritability. Having panic attacks 3 times a week. Has tried to handle things on his own and it's not working. Gets fixated on things and has a hard time letting things go. Reports worry and rumination. Has taken Zoloft in the past and thought it worked well. Feels like he doesn't having the right coping mechanisms. Stable interest and motivation. Taking medications as prescribed.  Energy levels lower. Active, does not have a regular exercise routine.  Enjoys some usual interests and activities. Single. Living with parents - brother - grandmother. Spending time with family. Appetite decreased. Weight stable 120 pounds. Lost 30 pounds over the past year. Difficulties with sleep for months. Averages 3 to 4 hours. Mind racing. Thinking about what could have been. Focus and concentration terrible. Completing tasks. Managing aspects of household. Works full time at [employer] 40 hours a week. Denies SI or HI.  Denies AH or VH.  Previous medication trials: Prozac  Visit Diagnosis:    ICD-10-CM   1. Generalized anxiety disorder  F41.1 sertraline (ZOLOFT) 50 MG tablet  2. Major depressive disorder, recurrent episode, moderate (HCC)  F33.1 sertraline (ZOLOFT) 50 MG tablet  3. Insomnia, unspecified type  G47.00 sertraline (ZOLOFT) 50 MG tablet  4. Obsessional thoughts  F42.8 sertraline (ZOLOFT) 50 MG tablet    Past Psychiatric History: Denies psychiatric hospitalization.  Past Medical History:  Past Medical History:  Diagnosis Date  . Breast cancer ([place])   . Neuroblastoma of adrenal gland ([place])   . Skin cancer   . Status post autologous bone marrow transplant [facility])     Past Surgical History:  Procedure Laterality Date  . ADRENALECTOMY     . MASTECTOMY      Family Psychiatric History: Family members with mental heath issues.   Family History:  Family History  Problem Relation Age of Onset  . Cancer Maternal Grandfather 77       colorectal  . Cancer Other 25       mat great uncle (through [facility]) with possible neuroblastoma  . Cancer Other 38       male mat first cousin once removed with breast cancer (related through MGF's sister)    Social History:  Social History   Socioeconomic History  . Marital status: Single    Spouse name: Not on file  . Number of children: Not on file  . Years of education: Not on file  . Highest education level: Not on file  Occupational History  . Not on file  Tobacco Use  . Smoking status: Never Smoker  . Smokeless tobacco: Never Used  Substance and Sexual Activity  . Alcohol use: Yes    Comment: occ  . Drug use: Yes    Types: Marijuana  . Sexual activity: Not on file  Other Topics Concern  . Not on file  Social History Narrative  . Not on file   Social Determinants of Health   Financial Resource Strain:   . Difficulty of Paying Living Expenses: Not on file  Food Insecurity:   . Worried About Charity fundraiser in the Last Year: Not on file  . Ran Out of Food in the Last Year: Not on file  Transportation Needs:   .  Lack of Transportation (Medical): Not on file  . Lack of Transportation (Non-Medical): Not on file  Physical Activity:   . Days of Exercise per Week: Not on file  . Minutes of Exercise per Session: Not on file  Stress:   . Feeling of Stress : Not on file  Social Connections:   . Frequency of Communication with Friends and Family: Not on file  . Frequency of Social Gatherings with Friends and Family: Not on file  . Attends Religious Services: Not on file  . Active Member of Clubs or Organizations: Not on file  . Attends Archivist Meetings: Not on file  . Marital Status: Not on file    Allergies:  Allergies  Allergen Reactions  .  Bactrim [Sulfamethoxazole-Trimethoprim] Itching    Metabolic Disorder Labs: No results found for: HGBA1C, MPG No results found for: PROLACTIN No results found for: CHOL, TRIG, HDL, CHOLHDL, VLDL, LDLCALC Lab Results  Component Value Date   TSH 1.944 02/07/2010    Therapeutic Level Labs: No results found for: LITHIUM No results found for: VALPROATE No components found for:  CBMZ  Current Medications: Current Outpatient Medications  Medication Sig Dispense Refill  . sertraline (ZOLOFT) 50 MG tablet Take 1 tablet (50 mg total) by mouth daily. 30 tablet 2   No current facility-administered medications for this visit.    Medication Side Effects: none  Orders placed this visit:  No orders of the defined types were placed in this encounter.   Psychiatric Specialty Exam:  Review of Systems  Blood pressure 125/87, pulse 74, height 5\' 7"  (1.702 m), weight 120 lb (54.4 kg).Body mass index is 18.79 kg/m.  General Appearance: Casual, Neat and Well Groomed  Eye Contact:  Good  Speech:  Clear and Coherent and Normal Rate  Volume:  Normal  Mood:  Anxious and Depressed  Affect:  Appropriate and Congruent  Thought Process:  Coherent and Descriptions of Associations: Intact  Orientation:  Full (Time, Place, and Person)  Thought Content: Logical   Suicidal Thoughts:  No  Homicidal Thoughts:  No  Memory:  WNL  Judgement:  Good  Insight:  Good  Psychomotor Activity:  Normal  Concentration:  Concentration: Good  Recall:  Good  Fund of Knowledge: Good  Language: Good  Assets:  Communication Skills Desire for Improvement Financial Resources/Insurance Housing Intimacy Leisure Time Physical Health Resilience Social Support Talents/Skills Transportation Vocational/Educational  ADL's:  Intact  Cognition: WNL  Prognosis:  Good   Screenings: MDQ  Receiving Psychotherapy: No   Treatment Plan/Recommendations:   Plan:  PDMP reviewed  1. Add Zoloft 50mg  daily - taken  previously and worked well for him  Will request records from [facility]  RTC 4 weeks  Patient advised to contact office with any questions, adverse effects, or acute worsening in signs and symptoms.   Aloha Gell, NP

## 2020-04-01 NOTE — ED Triage Notes (Signed)
Per EMS, patient from home, reports taking delta 8 gummy for the first time at [facility] and began getting anxious. 2 episodes of vomiting with EMS.

## 2020-04-02 ENCOUNTER — Emergency Department (HOSPITAL_COMMUNITY)
Admission: EM | Admit: 2020-04-02 | Discharge: 2020-04-02 | Disposition: A | Discharge status: Home / Self Care | Payer: BC Managed Care – PPO | Attending: Emergency Medicine | Admitting: Emergency Medicine

## 2020-04-02 DIAGNOSIS — R002 Palpitations: Secondary | ICD-10-CM

## 2020-04-02 DIAGNOSIS — F419 Anxiety disorder, unspecified: Secondary | ICD-10-CM

## 2020-04-02 NOTE — Discharge Instructions (Addendum)
You were seen today after taking a CBD gummy.  This likely related to your episode of anxiety.  Avoid drug use as this often will trigger anxiety.

## 2020-04-02 NOTE — ED Provider Notes (Signed)
[name] Provider Note   CSN: [id number] Arrival date & time: 04/01/20  2112     History Chief Complaint  Patient presents with  . Anxiety    Philip Marquez is a 29 y.o. male.  HPI     This is a 29 year old with a history of neuroblastoma who presents with anxiety.  Patient reports that he ate a 500 mg delta 8 gummy.  He does not usually use Gummies.  He denies any other illicit drug use tonight.  After that he had onset of palpitations and this made him very anxious.  He reports a history of anxiety and attacks.  He ingested this approximately 4 hours ago.  He currently states that he is okay and back to his normal.  His symptoms have completely resolved.  He did have some nausea but no vomiting.  No chest pain, shortness of breath, fevers, abdominal pain.  Past Medical History:  Diagnosis Date  . Breast cancer ([employer])   . Neuroblastoma of adrenal gland ([place])   . Skin cancer   . Status post autologous bone marrow transplant [facility])     Patient Active Problem List   Diagnosis Date Noted  . Neuroblastoma of adrenal gland ([place]) 12/28/2013  . Family history of malignant neoplasm of gastrointestinal tract 12/28/2013  . Family history of malignant neoplasm of breast 12/28/2013  . Breast cancer of lower-outer quadrant of right male breast ([place]) 12/24/2013    Past Surgical History:  Procedure Laterality Date  . ADRENALECTOMY    . MASTECTOMY         Family History  Problem Relation Age of Onset  . Cancer Maternal Grandfather 4       colorectal  . Cancer Other 25       mat great uncle (through [facility]) with possible neuroblastoma  . Cancer Other 16       male mat first cousin once removed with breast cancer (related through MGF's sister)    Social History   Tobacco Use  . Smoking status: Never Smoker  . Smokeless tobacco: Never Used  Substance Use Topics  . Alcohol use: Yes    Comment: occ  . Drug use: Yes    Types: Marijuana     Home Medications Prior to Admission medications   Medication Sig Start Date End Date Taking? Authorizing Provider  sertraline (ZOLOFT) 50 MG tablet Take 1 tablet (50 mg total) by mouth daily. 04/01/20  Yes Mozingo, Berdie Ogren, NP    Allergies    Bactrim [sulfamethoxazole-trimethoprim]  Review of Systems   Review of Systems  Constitutional: Negative for fever.  Respiratory: Negative for shortness of breath.   Cardiovascular: Positive for palpitations. Negative for chest pain and leg swelling.  Gastrointestinal: Positive for nausea. Negative for abdominal pain, diarrhea and vomiting.  Genitourinary: Negative for dysuria.  Psychiatric/Behavioral: The patient is nervous/anxious.   All other systems reviewed and are negative.   Physical Exam Updated Vital Signs BP 129/89   Pulse 82   Temp 98 F (36.7 C) (Oral)   Resp 15   SpO2 99%   Physical Exam Vitals and nursing note reviewed.  Constitutional:      Appearance: Normal appearance. He is well-developed. He is not ill-appearing.  HENT:     Head: Normocephalic and atraumatic.     Nose: Nose normal.     Mouth/Throat:     Mouth: Mucous membranes are moist.  Eyes:     Pupils: Pupils are equal, round, and  reactive to light.  Cardiovascular:     Rate and Rhythm: Normal rate and regular rhythm.     Heart sounds: Normal heart sounds. No murmur heard.   Pulmonary:     Effort: Pulmonary effort is normal. No respiratory distress.     Breath sounds: Normal breath sounds. No wheezing.  Abdominal:     General: Bowel sounds are normal.     Palpations: Abdomen is soft.     Tenderness: There is no abdominal tenderness. There is no rebound.  Musculoskeletal:     Cervical back: Neck supple.     Right lower leg: No edema.     Left lower leg: No edema.  Lymphadenopathy:     Cervical: No cervical adenopathy.  Skin:    General: Skin is warm and dry.  Neurological:     Mental Status: He is alert and oriented to person, place,  and time.  Psychiatric:        Mood and Affect: Mood normal.     ED Results / Procedures / Treatments   Labs (all labs ordered are listed, but only abnormal results are displayed) Labs Reviewed - No data to display  EKG EKG Interpretation  Date/Time:  Saturday April 02 2020 00:52:56 EDT Ventricular Rate:  79 PR Interval:    QRS Duration: 81 QT Interval:  377 QTC Calculation: 433 R Axis:   59 Text Interpretation: Sinus rhythm ST elev, probable normal early repol pattern No significant change since last tracing Confirmed by Thayer Jew [phone]) on 04/02/2020 1:00:27 AM   Radiology No results found.  Procedures Procedures (including critical care time)  Medications Ordered in ED Medications - No data to display  ED Course  I have reviewed the triage vital signs and the nursing notes.  Pertinent labs & imaging results that were available during my care of the patient were reviewed by me and considered in my medical decision making (see chart for details).    MDM Rules/Calculators/A&P                          Patient presents with anxiety.  Onset after taking a 500 mg CBD gummy.  He is now back to his baseline.  EKG is without acute ischemic or arrhythmic changes.  No tachycardia.  Given the patient is now asymptomatic and onset of symptoms correlates with ingestion of delta AMA, have high suspicion that these are related.  He also has a history of anxiety.  Otherwise he has no physical complaints at this time.  I discussed with him that often illicit drug use can trigger palpitations or anxiety.  Also, 500 mg gummy is quite strong if he is not used to taking these.  I encouraged him to discontinue use.  After history, exam, and medical workup I feel the patient has been appropriately medically screened and is safe for discharge home. Pertinent diagnoses were discussed with the patient. Patient was given return precautions.  Final Clinical Impression(s) / ED  Diagnoses Final diagnoses:  Anxiety  Palpitations    Rx / DC Orders ED Discharge Orders    None       Nelia Rogoff, Barbette Hair, MD 04/02/20 [phone]

## 2020-04-04 ENCOUNTER — Telehealth: Payer: Self-pay | Admitting: Adult Health

## 2020-04-04 ENCOUNTER — Telehealth: Payer: Self-pay | Admitting: Psychiatry

## 2020-04-04 DIAGNOSIS — F411 Generalized anxiety disorder: Secondary | ICD-10-CM

## 2020-04-04 MED ORDER — PROPRANOLOL HCL 10 MG PO TABS: ORAL_TABLET | ORAL | 1 refills | Status: DC

## 2020-04-04 NOTE — Telephone Encounter (Signed)
Noted. He had taken this medication previously without any issues.

## 2020-04-04 NOTE — Telephone Encounter (Signed)
Received after hours call from pt's mother who reports that pt has had worsening anxiety over the last few days. Pt then joins by speaker phone. He reports that he recently saw Garwin Brothers, DNP and was started on Zoloft. He reports that he took Zoloft in the past and had forgotten that it had been prescribed in combination with a beta blocker, likely Propranolol, since he had adverse reaction to Prozac. He reports that he had tolerated Zoloft in the past in combination with a beta blocker. He reports that he had increased anxiety after taking Zoloft and went to the ED on 04/02/20. He reports that he also "made a mistake" and took it with CBD and feels that he is continuing to have "residual effects." He took Zoloft again yesterday and again experienced increased anxiety. He reports that he did not take Zoloft today. He reports that he has felt "over-stimulated by everything" and describes having an increased emotional response to both negative and positive stimuli. He reports that he was having SI when he presented to the ED. Denies current SI.  Advised not to re-start Zoloft until advised by provider. Discussed potential benefits, risks, and side effects of Propranolol. Discussed starting Propranolol immediately for anxiety. Pt agrees to starting Propranolol. Script sent to 24-hour pharmacy for Propranolol. Discussed that information would be relayed to Garwin Brothers, DNP.  Patient advised to contact office with any questions, adverse effects, or acute worsening in signs and symptoms.

## 2020-04-04 NOTE — Telephone Encounter (Signed)
Left patient message to call back to discuss side effects tomorrow after 9 am

## 2020-04-04 NOTE — Telephone Encounter (Signed)
Pt left message on v-mail. Having side effects from Zoloft. Don't want to continue meds. Contact # [phone].

## 2020-04-05 ENCOUNTER — Telehealth: Payer: Self-pay | Admitting: Adult Health

## 2020-04-05 NOTE — Telephone Encounter (Signed)
Tried to reach patient and had to leave a voicemail.

## 2020-04-05 NOTE — Telephone Encounter (Signed)
Noted  

## 2020-04-05 NOTE — Telephone Encounter (Signed)
Noted. Can we follow up with patient today?

## 2020-04-05 NOTE — Telephone Encounter (Signed)
I will call him in just a few minutes, he's called several times this morning.

## 2020-04-05 NOTE — Telephone Encounter (Signed)
Noted. He can continue the Propranolol. I am not going to make any further changes until his notes are received from previous provider.

## 2020-04-05 NOTE — Telephone Encounter (Signed)
Patient called again regarding the reaction to Zoloft. He said he had a bad reaction from it and was admitted this past Friday night at [facility]. He was discharged the same night. His symptoms were massive panic attack, dizziness, nauseous and feels like he is floating. He was given Propanalol by Janett Billow last night but he said it is helping a little. Please call him at [phone].

## 2020-04-06 NOTE — Telephone Encounter (Signed)
See previous phone message, already been addressed.

## 2020-04-06 NOTE — Telephone Encounter (Signed)
Was able to catch up with patient today, he is feeling better with the Propranolol 10 mg. He mentioned sometimes his anxiety is a bit worse, advised him it's okay to take 2 tablets (20 mg) as written on bottle.   He reports he took 2 doses of Zoloft, he started having anxiety, panic, confusion and dissociation. Then went to the ER on Friday via ambulance. Asked him if he had taken any other drugs along with that, he admitted to CBD Gummy's. Advised him that he should not be taking the CBD Gummy's with any medication because of what kind of interaction that could cause. He agreed. He reports his parents do not want him to try Zoloft again.  Informed him Rollene Fare was waiting on records from [facility] and would follow up after receiving. Advised him to call if having any problems.

## 2020-04-06 NOTE — Telephone Encounter (Signed)
Noted  

## 2020-04-14 ENCOUNTER — Other Ambulatory Visit: Payer: Self-pay

## 2020-04-14 ENCOUNTER — Ambulatory Visit (INDEPENDENT_AMBULATORY_CARE_PROVIDER_SITE_OTHER): Payer: BC Managed Care – PPO | Admitting: Mental Health

## 2020-04-14 DIAGNOSIS — F331 Major depressive disorder, recurrent, moderate: Secondary | ICD-10-CM

## 2020-04-14 DIAGNOSIS — F411 Generalized anxiety disorder: Secondary | ICD-10-CM

## 2020-04-14 NOTE — Progress Notes (Signed)
[facility] Counsel[facility] Initial Adult Exam  Name: Philip Marquez Date: 04/14/2020 MRN: [id number] DOB: 08/17/1990 PCP: Pcp, No  Time spent: 53 minutes  Reason for Visit /Presenting Problem: Patient reports a hx of counseling at [facility] in 2019. Felt that was helpful at that time. Currently, is doubting my decisions.  Questions "where I should be in life" due to his age. Wants to start dating again. He has a younger brother who is soon to be married, this makes him question why this is not occurring. During the pandemic, he began to realize/question not having someone in his life romantically Philip term. He is a Freight forwarder at [employer]. Dealing w/ some personalities at work can be challenging, he does not like conflict, has to confront his employees which he does not like having to do. He has been a Freight forwarder for the past 2 months, "I feel like I'm failing". He inherited "a mess" from the last manager who quit; he only got one day of training. His regional director is only marginally helpful. He struggles to understand his own mgmt style, feels he is sometimes mimicking his boss. He is in the process of buying a house, feels some pressure to make his job "work". Parents are supportive but knows her mother has her own issues. His father can be passionate and intense which is "too much" at times, would like him to just listen.  Feeling high anxiety, especially going back to living w/ his parents about 3 months ago. They "dictate how my life should be", his mother appears sad and he feels like its bc of him.  2 weeks ago, he accidentally mixed CBD gummies w/ his zoloft, this resulted in his going to the ER, he was evaluated and discharged 3 hours later.   Mental Status Exam:   Appearance:   Casual     Behavior:  Appropriate  Motor:  Normal  Speech/Language:   Clear and Coherent  Affect:  Congruent  Mood:  anxious and depressed  Thought process:  normal  Thought content:    WNL  Sensory/Perceptual  disturbances:    none  Orientation:  x4  Attention:  Good  Concentration:  Good  Memory:  WNL  Fund of knowledge:   Good  Insight:    Good  Judgment:   Good  Impulse Control:  Good   Reported Symptoms:  High anxiety, sleep dist. Sometimes 3 hours, then 8 hours, ruminates about "I'm not doing well, I'm not doing what I'm supposed to be doing", appetite dist. -eats only once/day now since moving back home. Growing up low income, he and his brother were limited about how much they could go into the refrigerator. This   Risk Assessment: Danger to Self:  none Self-injurious Behavior: none Danger to Others: none Duty to Warn:no Physical Aggression / Violence:No  Access to Firearms a concern: No  Gang Involvement:No  Patient / guardian was educated about steps to take if suicide or homicide risk level increases between visits: yes While future psychiatric events cannot be accurately predicted, the patient does not currently require acute inpatient psychiatric care and does not currently meet [facility] involuntary commitment criteria.  Substance Abuse History: Current substance abuse: none   Past Psychiatric History:   Outpatient Providers: therapy at [facility] 2019 History of Psych Hospitalization: none Psychological Testing: noone  Family History:  Family History  Problem Relation Age of Onset  . Cancer Maternal Grandfather 77       colorectal  . Cancer Other  25       mat great uncle (through [facility]) with possible neuroblastoma  . Cancer Other 15       male mat first cousin once removed with breast cancer (related through MGF's sister)    Medical History/Surgical History: Past Medical History:  Diagnosis Date  . Breast cancer ([place])   . 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 Skin cancer   . Status post autologous bone marrow transplant [facility])     Past Surgical History:  Procedure Laterality Date  . ADRENALECTOMY    . MASTECTOMY      Medications: Current Outpatient  Medications  Medication Sig Dispense Refill  . propranolol (INDERAL) 10 MG tablet Take 1-2 tabs po BID prn anxiety 120 tablet 1  . sertraline (ZOLOFT) 50 MG tablet Take 1 tablet (50 mg total) by mouth daily. 30 tablet 2   No current facility-administered medications for this visit.    Allergies  Allergen Reactions  . Bactrim [Sulfamethoxazole-Trimethoprim] Itching    Diagnoses:    ICD-10-CM   1. Generalized anxiety disorder  F41.1   2. Major depressive disorder, recurrent episode, moderate ([place])  F33.1     Plan of Care: TBD   Anson [place], [facility]

## 2020-04-15 DIAGNOSIS — N6320 Unspecified lump in the left breast, unspecified quadrant: Secondary | ICD-10-CM | POA: Diagnosis not present

## 2020-04-15 DIAGNOSIS — R928 Other abnormal and inconclusive findings on diagnostic imaging of breast: Secondary | ICD-10-CM | POA: Diagnosis not present

## 2020-04-29 ENCOUNTER — Encounter: Payer: Self-pay | Admitting: Adult Health

## 2020-04-29 ENCOUNTER — Ambulatory Visit (INDEPENDENT_AMBULATORY_CARE_PROVIDER_SITE_OTHER): Payer: BC Managed Care – PPO | Admitting: Adult Health

## 2020-04-29 ENCOUNTER — Other Ambulatory Visit: Payer: Self-pay

## 2020-04-29 DIAGNOSIS — F331 Major depressive disorder, recurrent, moderate: Secondary | ICD-10-CM

## 2020-04-29 DIAGNOSIS — F428 Other obsessive-compulsive disorder: Secondary | ICD-10-CM | POA: Diagnosis not present

## 2020-04-29 DIAGNOSIS — G4700 Insomnia, unspecified: Secondary | ICD-10-CM

## 2020-04-29 DIAGNOSIS — F411 Generalized anxiety disorder: Secondary | ICD-10-CM

## 2020-04-29 MED ORDER — PROPRANOLOL HCL 10 MG PO TABS: ORAL_TABLET | ORAL | 2 refills | Status: DC

## 2020-04-29 NOTE — Progress Notes (Signed)
Philip Marquez [id number] Oct 05, 1990 29 y.o.  Subjective:   Patient ID:  Philip Marquez is a 29 y.o. (DOB [birth year]) male.  Chief Complaint: No chief complaint on file.   HPI Philip Marquez presents to the office today for follow-up of MDD, GAD, insomnia, and obsessional thoughts..  Describes mood today as "so-so". Pleasant. Flat. Mood symptoms - denies depression - "more optimistic now". Denies irritability. Continues to feel anxious, but feels like the Propanolol is helping. Gets confused and startled at times. Denies any panic attacks. Able to take a "moment" and move on. Feels like he is on the "cusp" of having one 3 to 4 times a day, but is able to work through it. Thoughts still coming into his head, but are not getting stuck in his head. Not getting fixated on things - "I am able to walk myself through it". Stable interest and motivation. Taking medications as prescribed.  Energy levels improved. Active, does not have a regular exercise routine.  Enjoys some usual interests and activities. Single. Buying a house. Living with parents currently - brother - grandmother. Spending time with family. Appetite improved - "a lot better". Weight stable 120 pounds. Sleep has improved. Averages 7 to 8 hours. Listening to mediatation. Focus and concentration stable. Completing tasks. Managing aspects of household. Works full time at [employer] 40 hours a week. Denies SI or HI.  Denies AH or VH.  Previous medication trials: Prozac   Review of Systems:  Review of Systems  Musculoskeletal: Negative for gait problem.  Neurological: Negative for tremors.  Psychiatric/Behavioral:       Please refer to HPI    Medications: I have reviewed the patient's current medications.  Current Outpatient Medications  Medication Sig Dispense Refill  . propranolol (INDERAL) 10 MG tablet Take 1-2 tabs po BID prn anxiety 120 tablet 2   No current facility-administered medications for this  visit.    Medication Side Effects: None  Allergies:  Allergies  Allergen Reactions  . Bactrim [Sulfamethoxazole-Trimethoprim] Itching    Past Medical History:  Diagnosis Date  . Breast cancer ([place])   . 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 Skin cancer   . Status post autologous bone marrow transplant ([place])     Family History  Problem Relation Age of Onset  . Cancer Maternal Grandfather 41       colorectal  . Cancer Other 25       mat great uncle (through [facility]) with possible neuroblastoma  . Cancer Other 44       male mat first cousin once removed with breast cancer (related through MGF's sister)    Social History   Socioeconomic History  . Marital status: Single    Spouse name: Not on file  . Number of children: Not on file  . Years of education: Not on file  . Highest education level: Not on file  Occupational History  . Not on file  Tobacco Use  . Smoking status: Never Smoker  . Smokeless tobacco: Never Used  Substance and Sexual Activity  . Alcohol use: Yes    Comment: occ  . Drug use: Yes    Types: Marijuana  . Sexual activity: Not on file  Other Topics Concern  . Not on file  Social History Narrative  . Not on file   Social Determinants of Health   Financial Resource Strain:   . Difficulty of Paying Living Expenses: Not on file  Food Insecurity:   .  Worried About Charity fundraiser in the Last Year: Not on file  . Ran Out of Food in the Last Year: Not on file  Transportation Needs:   . Lack of Transportation (Medical): Not on file  . Lack of Transportation (Non-Medical): Not on file  Physical Activity:   . Days of Exercise per Week: Not on file  . Minutes of Exercise per Session: Not on file  Stress:   . Feeling of Stress : Not on file  Social Connections:   . Frequency of Communication with Friends and Family: Not on file  . Frequency of Social Gatherings with Friends and Family: Not on file  . Attends Religious Services: Not on file   . Active Member of Clubs or Organizations: Not on file  . Attends Archivist Meetings: Not on file  . Marital Status: Not on file  Intimate Partner Violence:   . Fear of Current or Ex-Partner: Not on file  . Emotionally Abused: Not on file  . Physically Abused: Not on file  . Sexually Abused: Not on file    Past Medical History, Surgical history, Social history, and Family history were reviewed and updated as appropriate.   Please see review of systems for further details on the patient's review from today.   Objective:   Physical Exam:  There were no vitals taken for this visit.  Physical Exam Constitutional:      General: He is not in acute distress. Musculoskeletal:        General: No deformity.  Neurological:     Mental Status: He is alert and oriented to person, place, and time.     Coordination: Coordination normal.  Psychiatric:        Attention and Perception: Attention and perception normal. He does not perceive auditory or visual hallucinations.        Mood and Affect: Mood normal. Mood is not anxious or depressed. Affect is not labile, blunt, angry or inappropriate.        Speech: Speech normal.        Behavior: Behavior normal.        Thought Content: Thought content normal. Thought content is not paranoid or delusional. Thought content does not include homicidal or suicidal ideation. Thought content does not include homicidal or suicidal plan.        Cognition and Memory: Cognition and memory normal.        Judgment: Judgment normal.     Comments: Insight intact     Lab Review:     Component Value Date/Time   NA 142 07/01/2016 0634   NA 142 12/28/2013 1505   K 3.8 07/01/2016 0634   K 4.1 12/28/2013 1505   CL 104 07/01/2016 0634   CO2 28 07/01/2016 0634   CO2 32 (H) 12/28/2013 1505   GLUCOSE 110 (H) 07/01/2016 0634   GLUCOSE 106 12/28/2013 1505   BUN 9 07/01/2016 0634   BUN 10.6 12/28/2013 1505   CREATININE 0.98 07/01/2016 0634    CREATININE 1.0 12/28/2013 1505   CALCIUM 9.5 07/01/2016 0634   CALCIUM 9.7 12/28/2013 1505   PROT 6.6 07/01/2016 0634   PROT 7.8 12/28/2013 1505   ALBUMIN 4.1 07/01/2016 0634   ALBUMIN 4.4 12/28/2013 1505   AST 22 07/01/2016 0634   AST 19 12/28/2013 1505   ALT 14 (L) 07/01/2016 0634   ALT 12 12/28/2013 1505   ALKPHOS 44 07/01/2016 0634   ALKPHOS 47 12/28/2013 1505   BILITOT 0.2 (L) 07/01/2016  0634   BILITOT 0.77 12/28/2013 1505   GFRNONAA >60 07/01/2016 0634   GFRAA >60 07/01/2016 0634       Component Value Date/Time   WBC 7.8 07/28/2016 1552   RBC 4.35 07/28/2016 1552   HGB 13.0 07/28/2016 1552   HGB 13.5 12/28/2013 1501   HCT 37.5 (L) 07/28/2016 1552   HCT 40.3 12/28/2013 1501   PLT 187 07/28/2016 1552   PLT 213 12/28/2013 1501   MCV 86.2 07/28/2016 1552   MCV 90.6 12/28/2013 1501   MCH 29.9 07/28/2016 1552   MCHC 34.7 07/28/2016 1552   RDW 13.1 07/28/2016 1552   RDW 13.3 12/28/2013 1501   LYMPHSABS 1.7 07/28/2016 1552   LYMPHSABS 1.6 12/28/2013 1501   MONOABS 1.2 (H) 07/28/2016 1552   MONOABS 0.4 12/28/2013 1501   EOSABS 0.0 07/28/2016 1552   EOSABS 0.0 12/28/2013 1501   BASOSABS 0.0 07/28/2016 1552   BASOSABS 0.0 12/28/2013 1501    No results found for: POCLITH, LITHIUM   No results found for: PHENYTOIN, PHENOBARB, VALPROATE, CBMZ   .res Assessment: Plan:    Plan:  PDMP reviewed  1. Propanolol 10mg  - take 1 to 2 tablets twice daily.  Will request records from [facility]  RTC 4 weeks  Patient advised to contact office with any questions, adverse effects, or acute worsening in signs and symptoms.   Diagnoses and all orders for this visit:  Insomnia, unspecified type  Major depressive disorder, recurrent episode, moderate (HCC)  Obsessional thoughts  Generalized anxiety disorder -     propranolol (INDERAL) 10 MG tablet; Take 1-2 tabs po BID prn anxiety     Please see After Visit Summary for patient specific instructions.  Future Appointments   Date Time Provider [place]  05/12/2020  9:00 AM Anson [place], [facility] CP-CP None  06/02/2020  4:00 PM Anson [place], [facility] CP-CP None    No orders of the defined types were placed in this encounter.   -------------------------------

## 2020-05-12 ENCOUNTER — Other Ambulatory Visit: Payer: Self-pay

## 2020-05-12 ENCOUNTER — Ambulatory Visit (INDEPENDENT_AMBULATORY_CARE_PROVIDER_SITE_OTHER): Payer: BC Managed Care – PPO | Admitting: Mental Health

## 2020-05-12 DIAGNOSIS — F331 Major depressive disorder, recurrent, moderate: Secondary | ICD-10-CM | POA: Diagnosis not present

## 2020-05-12 NOTE — Progress Notes (Addendum)
[facility] Psychotherapy Note  Name: Philip Marquez Date: 05/12/2020 MRN: [id number] DOB: 03-01-1991 PCP: Pcp, No  Time spent: 53 minutes  Treatment:  Ind. therapy  Mental Status Exam:   Appearance:   Casual     Behavior:  Appropriate  Motor:  Normal  Speech/Language:   Clear and Coherent  Affect:  Congruent  Mood:  anxious and depressed  Thought process:  normal  Thought content:    WNL  Sensory/Perceptual disturbances:    none  Orientation:  x4  Attention:  Good  Concentration:  Good  Memory:  WNL  Fund of knowledge:   Good  Insight:    Good  Judgment:   Good  Impulse Control:  Good   Reported Symptoms:  High anxiety, sleep dist. Sometimes 3 hours, then 8 hours, ruminates about "I'm not doing well, I'm not doing what I'm supposed to be doing", appetite dist. -eats only once/day now since moving back home. Growing up low income, he and his brother were limited about how much they could go into the refrigerator. This   Risk Assessment: Danger to Self:  none Self-injurious Behavior: none Danger to Others: none Duty to Warn:no Physical Aggression / Violence:No  Access to Firearms a concern: No  Gang Involvement:No  Patient / guardian was educated about steps to take if suicide or homicide risk level increases between visits: yes While future psychiatric events cannot be accurately predicted, the patient does not currently require acute inpatient psychiatric care and does not currently meet [facility] involuntary commitment criteria.  Substance Abuse History: Current substance abuse: none   Past Psychiatric History:   Outpatient Providers: therapy at [facility] 2019 History of Psych Hospitalization: none Psychological Testing: noone  Family History: medical: Family History  Problem Relation Age of Onset  . Cancer Maternal Grandfather 43       colorectal  . Cancer Other 25       mat great uncle (through [facility]) with possible neuroblastoma  . Cancer Other 6        male mat first cousin once removed with breast cancer (related through MGF's sister)    Medical History/Surgical History: Past Medical History:  Diagnosis Date  . Breast cancer ([place])   . 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 Skin cancer   . Status post autologous bone marrow transplant [facility])     Past Surgical History:  Procedure Laterality Date  . ADRENALECTOMY    . MASTECTOMY      Medications: Current Outpatient Medications  Medication Sig Dispense Refill  . propranolol (INDERAL) 10 MG tablet Take 1-2 tabs po BID prn anxiety 120 tablet 2   No current facility-administered medications for this visit.    Allergies  Allergen Reactions  . Bactrim [Sulfamethoxazole-Trimethoprim] Itching    Abuse History: Victim: none Report needed: no Perpetrator of abuse: no Witness / Exposure to Domestic Violence:  none Protective Services Involvement: no Witness to [employer] Violence:  no   Family / Social History:   Raised by both parents. 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  Completed part two of the assessment with patient continuing to gather relevant history and further identify needs  and goals for therapy. 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  We explored collaboratively ways to cope with stress at work, dealing with customers particularly this time of year due to the high level shopping around the holiday season.  Assisted him in identifying and processing thoughts and feelings related to how he tries to manage his stress at work, deal effectively with customers toward not getting as stressed or upset.  Discussed giving himself a few moments prior to going into work to identify how he wants to respond to others as a way to be more prepared and cognizant of how he wants to present himself and handle the various challenging situations that customers can bring.  He went on to share family related stressors, sharing specific family dynamics, relationship experiences with his mother brother and father.  He stated that he has had somewhat of a strained relationship with his father over the years, is also being treated with his older brother.  He shared some specific details about an argument they had last evening where he stated that he was able to set a boundary by verbalizing how he does not feel that he is being heard by his father often.  He stated his mother was supportive and also shared some feelings she had with him which he feels is significant as this has not occurred before. He is working on trying to say things once without being redundant to feel "heard", stating this can occur at times with peers at work and other relationships.  Through guided discovery, he identified how he feels this comes from his father not listening to him often throughout his life.   Interventions: Further assessment, supportive therapy, CBT   Diagnoses:    ICD-10-CM   1. Major depressive disorder, recurrent episode, moderate ([place])  F33.1      Plan: Patient is to use CBT, mindfulness and coping skills  to help manage decrease symptoms. Patient is to follow through on taking time before his work shift to think and plan how he wants to respond to others during the work shift.   Long-term goal:  Reduce overall level, frequency, and intensity of the feelings of depression, anxiety per patient report. Patient to have more self confidence at work regarding his ability and communications. Patient to feel more comfortable and content with with his life currently.  Short-term goal: To identify and process feelings related to the disappointment of past painful events that increase worthless feelings.                   Verbally express understanding of the relationship between depressed mood and repression of feelings - such as anger, hurt, and sadness.                              Patient to increase his assertiveness and communicating his wants and needs toward gaining more independence and  self-confidence        Patient to further clarify needs regarding having a significant other in his life   Assessment of progress:  progressing    Anson [place], [facility] [place]

## 2020-05-30 ENCOUNTER — Ambulatory Visit: Payer: BC Managed Care – PPO | Admitting: Adult Health

## 2020-06-02 ENCOUNTER — Ambulatory Visit: Payer: BC Managed Care – PPO | Admitting: Mental Health

## 2020-06-10 DIAGNOSIS — Z1152 Encounter for screening for COVID-19: Secondary | ICD-10-CM | POA: Diagnosis not present

## 2020-06-13 ENCOUNTER — Ambulatory Visit: Payer: BC Managed Care – PPO | Admitting: Mental Health

## 2020-06-15 ENCOUNTER — Ambulatory Visit: Payer: BC Managed Care – PPO | Admitting: Mental Health

## 2020-06-15 DIAGNOSIS — Z1152 Encounter for screening for COVID-19: Secondary | ICD-10-CM | POA: Diagnosis not present

## 2020-07-11 ENCOUNTER — Other Ambulatory Visit: Payer: Self-pay

## 2020-07-11 ENCOUNTER — Encounter (HOSPITAL_COMMUNITY): Payer: Self-pay

## 2020-07-11 ENCOUNTER — Telehealth: Payer: Self-pay | Admitting: Psychiatry

## 2020-07-11 ENCOUNTER — Emergency Department (HOSPITAL_COMMUNITY)
Admission: EM | Admit: 2020-07-11 | Discharge: 2020-07-11 | Disposition: A | Discharge status: Home / Self Care | Payer: BC Managed Care – PPO | Attending: Emergency Medicine | Admitting: Emergency Medicine

## 2020-07-11 DIAGNOSIS — Z20822 Contact with and (suspected) exposure to covid-19: Secondary | ICD-10-CM | POA: Insufficient documentation

## 2020-07-11 DIAGNOSIS — Z85828 Personal history of other malignant neoplasm of skin: Secondary | ICD-10-CM | POA: Diagnosis not present

## 2020-07-11 DIAGNOSIS — R45851 Suicidal ideations: Secondary | ICD-10-CM | POA: Insufficient documentation

## 2020-07-11 DIAGNOSIS — F419 Anxiety disorder, unspecified: Secondary | ICD-10-CM | POA: Insufficient documentation

## 2020-07-11 DIAGNOSIS — Z853 Personal history of malignant neoplasm of breast: Secondary | ICD-10-CM | POA: Insufficient documentation

## 2020-07-11 DIAGNOSIS — R9431 Abnormal electrocardiogram [ECG] [EKG]: Secondary | ICD-10-CM | POA: Diagnosis not present

## 2020-07-11 DIAGNOSIS — F410 Panic disorder [episodic paroxysmal anxiety] without agoraphobia: Secondary | ICD-10-CM

## 2020-07-11 LAB — COMPREHENSIVE METABOLIC PANEL
ALT: 20 U/L (ref 0–44)
AST: 28 U/L (ref 15–41)
Albumin: 5 g/dL (ref 3.5–5.0)
Alkaline Phosphatase: 51 U/L (ref 38–126)
Anion gap: 13 (ref 5–15)
BUN: 14 mg/dL (ref 6–20)
CO2: 26 mmol/L (ref 22–32)
Calcium: 9.9 mg/dL (ref 8.9–10.3)
Chloride: 102 mmol/L (ref 98–111)
Creatinine, Ser: 0.95 mg/dL (ref 0.61–1.24)
GFR, Estimated: >60 mL/min (ref >60)
Glucose, Bld: 120 mg/dL — ABNORMAL HIGH (ref 70–99)
Potassium: 4 mmol/L (ref 3.5–5.1)
Sodium: 141 mmol/L (ref 135–145)
Total Bilirubin: 0.8 mg/dL (ref 0.3–1.2)
Total Protein: 8.3 g/dL — ABNORMAL HIGH (ref 6.5–8.1)

## 2020-07-11 LAB — RESP PANEL BY RT-PCR (FLU A&B, COVID) ARPGX2
Influenza A by PCR: NEGATIVE
Influenza B by PCR: NEGATIVE
SARS Coronavirus 2 by RT PCR: NEGATIVE

## 2020-07-11 LAB — RAPID URINE DRUG SCREEN, HOSP PERFORMED
Amphetamines: NOT DETECTED
Barbiturates: NOT DETECTED
Benzodiazepines: NOT DETECTED
Cocaine: NOT DETECTED
Opiates: NOT DETECTED
Tetrahydrocannabinol: NOT DETECTED

## 2020-07-11 LAB — CBC
HCT: 37.9 % — ABNORMAL LOW (ref 39.0–52.0)
Hemoglobin: 13.2 g/dL (ref 13.0–17.0)
MCH: 30.6 pg (ref 26.0–34.0)
MCHC: 34.8 g/dL (ref 30.0–36.0)
MCV: 87.7 fL (ref 80.0–100.0)
Platelets: 210 10*3/uL (ref 150–400)
RBC: 4.32 MIL/uL (ref 4.22–5.81)
RDW: 12.9 % (ref 11.5–15.5)
WBC: 6.1 10*3/uL (ref 4.0–10.5)
nRBC: 0 % (ref 0.0–0.2)

## 2020-07-11 LAB — ETHANOL: Alcohol, Ethyl (B): <10 mg/dL (ref <10)

## 2020-07-11 LAB — ACETAMINOPHEN LEVEL: Acetaminophen (Tylenol), Serum: <10 ug/mL — ABNORMAL LOW (ref 10–30)

## 2020-07-11 LAB — SALICYLATE LEVEL: Salicylate Lvl: <7 mg/dL — ABNORMAL LOW (ref 7.0–30.0)

## 2020-07-11 MED ORDER — MIRTAZAPINE 15 MG PO TABS: 15.0000 mg | ORAL_TABLET | Freq: Every day | ORAL | 0 refills | Status: DC

## 2020-07-11 MED ORDER — ALPRAZOLAM 0.5 MG PO TABS
0.5000 mg | ORAL_TABLET | Freq: Once | ORAL | Status: AC
  Administered 2020-07-11: 0.5 mg via ORAL
  Filled 2020-07-11: qty 1

## 2020-07-11 NOTE — ED Provider Notes (Signed)
[facility] DEPT Provider Note   CSN: [id number] Arrival date & time: 07/11/20  0608     History No chief complaint on file.   JAYMAR LOEBER is a 30 y.o. male history of neuroblastoma, breast cancer, anxiety.  Patient reports he is not currently undergoing any chemo or radiation therapies.  Patient presents today for suicidal ideations he reports increasing anxiety over the past few days.  He reports that this morning he had thoughts of not wanting to live anymore he has no clear plan.  He reports that for his anxiety he took 210 mg propanolol which he is prescribed with minimal improvement of his symptoms.  He denies any other drug use.  He comes into the emergency department for help with suicidal ideations without plan.  He denies any recent illness, fever/chills, visual/auditory hallucinations, illicit drug use, alcohol use, homicidal ideations, toxic ingestions, self injury or any additional concerns.  HPI     Past Medical History:  Diagnosis Date  . Breast cancer ([place])   . 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 Skin cancer   . Status post autologous bone marrow transplant [facility])     Patient Active Problem List   Diagnosis Date Noted  . Generalized anxiety disorder 03/22/2014  . Breast cancer, right breast ([place]) 12/28/2013  . 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 Family history of malignant neoplasm of gastrointestinal tract 12/28/2013  . Family history of malignant neoplasm of breast 12/28/2013  . Breast cancer of lower-outer quadrant of right male breast ([place]) 12/24/2013    Past Surgical History:  Procedure Laterality Date  . ADRENALECTOMY    . MASTECTOMY         Family History  Problem Relation Age of Onset  . Cancer Maternal Grandfather 53       colorectal  . Cancer Other 25       mat great uncle (through [facility]) with possible neuroblastoma  . Cancer Other 21       male mat first cousin once removed with breast  cancer (related through MGF's sister)    Social History   Tobacco Use  . Smoking status: Never Smoker  . Smokeless tobacco: Never Used  Substance Use Topics  . Alcohol use: Yes    Comment: occ  . Drug use: Yes    Types: Marijuana    Home Medications Prior to Admission medications   Medication Sig Start Date End Date Taking? Authorizing Provider  mirtazapine (REMERON) 15 MG tablet Take 1 tablet (15 mg total) by mouth at bedtime. 07/11/20   Thayer Headings, PMHNP  propranolol (INDERAL) 10 MG tablet Take 1-2 tabs po BID prn anxiety 04/29/20   Mozingo, Berdie Ogren, NP    Allergies    Bactrim [sulfamethoxazole-trimethoprim]  Review of Systems   Review of Systems Ten systems are reviewed and are negative for acute change except as noted in the HPI  Physical Exam Updated Vital Signs BP 119/80   Pulse 64   Temp 98.2 F (36.8 C) (Oral)   Resp 12   Ht 5\' 7"  (1.702 m)   Wt 54.4 kg   SpO2 100%   BMI 18.79 kg/m   Physical Exam Constitutional:      General: He is not in acute distress.    Appearance: Normal appearance. He is well-developed. He is not ill-appearing or diaphoretic.  HENT:     Head: Normocephalic and atraumatic.  Eyes:     General: Vision grossly intact. Gaze aligned  appropriately.     Pupils: Pupils are equal, round, and reactive to light.  Neck:     Trachea: Trachea and phonation normal.  Pulmonary:     Effort: Pulmonary effort is normal. No respiratory distress.  Abdominal:     General: There is no distension.     Palpations: Abdomen is soft.     Tenderness: There is no abdominal tenderness. There is no guarding or rebound.  Musculoskeletal:        General: Normal range of motion.     Cervical back: Normal range of motion.  Skin:    General: Skin is warm and dry.  Neurological:     Mental Status: He is alert.     GCS: GCS eye subscore is 4. GCS verbal subscore is 5. GCS motor subscore is 6.     Comments: Speech is clear and goal oriented, follows  commands Major Cranial nerves without deficit, no facial droop Moves extremities without ataxia, coordination intact  Psychiatric:        Attention and Perception: He does not perceive auditory or visual hallucinations.        Mood and Affect: Affect is flat.        Behavior: Behavior normal. Behavior is cooperative.        Thought Content: Thought content includes suicidal ideation. Thought content does not include homicidal ideation. Thought content does not include suicidal plan.     ED Results / Procedures / Treatments   Labs (all labs ordered are listed, but only abnormal results are displayed) Labs Reviewed  COMPREHENSIVE METABOLIC PANEL - Abnormal; Notable for the following components:      Result Value   Glucose, Bld 120 (*)    Total Protein 8.3 (*)    All other components within normal limits  SALICYLATE LEVEL - Abnormal; Notable for the following components:   Salicylate Lvl <7.8 (*)    All other components within normal limits  ACETAMINOPHEN LEVEL - Abnormal; Notable for the following components:   Acetaminophen (Tylenol), Serum <10 (*)    All other components within normal limits  CBC - Abnormal; Notable for the following components:   HCT 37.9 (*)    All other components within normal limits  RESP PANEL BY RT-PCR (FLU A&B, COVID) ARPGX2  ETHANOL  RAPID URINE DRUG SCREEN, HOSP PERFORMED    EKG EKG Interpretation  Date/Time:  Monday July 11 2020 07:39:45 EST Ventricular Rate:  58 PR Interval:    QRS Duration: 83 QT Interval:  393 QTC Calculation: 386 R Axis:   42 Text Interpretation: Sinus rhythm NSR, no changes from previous Confirmed by Lavenia Atlas ([id number]) on 07/11/2020 7:52:13 AM   Radiology No results found.  Procedures Procedures   Medications Ordered in ED Medications - No data to display  ED Course  I have reviewed the triage vital signs and the nursing notes.  Pertinent labs & imaging results that were available during my care of the  patient were reviewed by me and considered in my medical decision making (see chart for details).    MDM Rules/Calculators/A&P                         Additional history obtained from: 1. Nursing notes from this visit. 2. EMR.  Review of most recent behavioral health visit 04/29/2020 shows patient is prescribed propanolol 10 mg 1-2 tabs p.o. twice daily as needed for anxiety. ------------------- I ordered, reviewed and interpreted labs which include: CBC  without leukocytosis to suggest infectious process or anemia. UDS negative. Ethanol negative, patient does not appear to be in withdrawal. Tylenol and salicylate levels are negative, no history of toxic ingestion of the substances. CMP shows no emergent electrolyte derangement, AKI, LFT elevations or gap.  EKG: Sinus rhythm NSR, no changes from previous Confirmed by Lavenia Atlas [phone]) on 07/11/2020 7:52:13 AM  Medical clearance labs and EKG are reassuring.  Patient well-appearing and in no acute distress.  Denies any history of toxic ingestions hallucinations recent illnesses or any additional concerns.  Consult placed to TTS.  At this time there does not appear to be any evidence of an acute emergency medical condition and the patient appears stable for psychiatric disposition.  Patient's case discussed with Dr. Dina Rich who agrees with plan to discharge with follow-up.   Note: Portions of this report may have been transcribed using voice recognition software. Every effort was made to ensure accuracy; however, inadvertent computerized transcription errors may still be present. Final Clinical Impression(s) / ED Diagnoses Final diagnoses:  Suicidal ideations    Rx / DC Orders ED Discharge Orders    None       Deliah Boston, PA-C 07/11/20 0800    Horton, Alvin Critchley, DO 07/11/20 1010

## 2020-07-11 NOTE — ED Triage Notes (Signed)
Pt arrives with c/o suicidal ideation with no plan. Pt reports hx of PTSD and anxiety.

## 2020-07-11 NOTE — BH Assessment (Signed)
Per Earleen Newport, NP patient does not meet criteria for inpatient treatment.  The recommendation is for patient to follow up with current providers with [facility]. Additional referral information included in AVS, as discussed with patient.

## 2020-07-11 NOTE — Discharge Instructions (Signed)
For your behavioral health needs, you are advised to continue treatment with [facility] and [facility].  If you are dissatisfied with the services that you are currently receiving from them, discuss the problems with your current providers to see if the they can be resolved:       [address] [place], [place] [address]      [phone]   Another provider of psychiatry and therapy in the community is [place]:       [facility]      [address]      [place],  [address]      [phone]

## 2020-07-11 NOTE — ED Notes (Signed)
Patient received breakfast tray 

## 2020-07-11 NOTE — Telephone Encounter (Signed)
He reports that he has not slept in 26 hours and having panic attacks. He reports that he is under a lot of stress at work and has been working everyday for 3 weeks. Scheduled to go to work in 3 hours.  He reports that he took 2 Propranolol 4-5 hours ago and it didn't help. He reports that he is having intrusive memories and re-experiencing."Maybe I need to go somewhere" and would benefit from a different environment. He reports racing thoughts. Had SI earlier and "that feeling turned into anxiety and that's scaring me." Denies SI at time of call.  PLAN: Discussed that he could go to [facility] or [facility] urgent care. He reports that he had a past negative experience and feels he may be more uncomfortable in certain environments. Discussed potential benefits, risks, and side effects of Remeron. Pt agrees to trial or Remeron 15 mg po QHS to improve panic and sleep. Discussed that work note could be provided for today and possibly 1-2 additional days if needed.

## 2020-07-11 NOTE — BH Assessment (Signed)
Comprehensive Clinical Assessment (CCA) Note  07/11/2020 Philip Marquez [id number]   Patient is a 30 year old male with a history of anxiety and depression who presents voluntarily to [facility] for assessment.  Patient reports worsening anxiety, which has caused worsening depression and passive SI.  He states he has had suicidal thoughts at times, typically when his anxiety symptoms become unmanageable.  He attributes worsening anxiety to work related stress from working 60 or more hours weekly.  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  This has affected his work/life balance and his sleep, as he is only sleeping 4 hours per night.  Patient is followed by [facility] for med management and he sees Lanetta Inch for therapy. Patient states he has missed appointments more often lately, especially with his work schedule.  He called the [facility] crisis line early this morning and his provider prescribed Remeron, however he was having more intrusive suicidal thoughts and decided to present to the ED. Patient reports SI earlier, however he had no plan.  He is denying current SI and he is able to contract for safety.  He plans to follow up with current providers, however is considering changing providers soon.     Disposition: Per Earleen Newport, NP patient does not meet criteria for inpatient treatment.  The recommendation is for patient to follow up with current providers. Additional referral information included in AVS, as discussed with patient.    Chief Complaint:  Chief Complaint  Patient presents with  . Depression  . Anxiety  . Suicidal    Passive SI   Visit Diagnosis: Anxiety Disorder Unspecified                             Depressive Disorder Unspecified   CCA Screening, Triage and Referral (STR)  Patient Reported Information How did you hear about [place]? Self  Referral name: No data recorded Referral phone number: No  data recorded  Whom do you see for routine medical problems? No data recorded Practice/Facility Name: No data recorded Practice/Facility Phone Number: No data recorded Name of Contact: No data recorded Contact Number: No data recorded Contact Fax Number: No data recorded Prescriber Name: No data recorded Prescriber Address (if known): No data recorded  What Is the Reason for Your Visit/Call Today? Patient reports worsening anxiety that, when extreme, leads to passive SI.  The thoughts were worse this morning, so patient called the crisis line for [facility].  How Long Has This Been Causing You Problems? <Week  What Do You Feel Would Help You the Most Today? Assessment Only   Have You Recently Been in Any Inpatient Treatment (Hospital/Detox/Crisis Center/28-Day Program)? No  Name/Location of Program/Hospital:No data recorded How Long Were You There? No data recorded When Were You Discharged? No data recorded  Have You Ever Received Services From [facility] Before? Yes  Who Do You See at [facility]? ER visits   Have You Recently Had Any Thoughts About Hurting Yourself? Yes  Are You Planning to Commit Suicide/Harm Yourself At This time? No   Have you Recently Had Thoughts About [place]? No  Explanation: No data recorded  Have You Used Any Alcohol or Drugs in the Past 24 Hours? No  How Long Ago Did You Use Drugs or Alcohol? No data recorded What Did You Use and How Much? No data recorded  Do You Currently Have a Therapist/Psychiatrist? Yes  Name of Therapist/Psychiatrist: [facility]: Regina Mozingo(med mgmt) Gerald Stabs Andrews(therapy)   Have You Been Recently Discharged From Any [facility] or Programs? No  Explanation of Discharge From Practice/Program: No data recorded    CCA Screening Triage Referral Assessment Type of Contact: Tele-Assessment  Is this Initial or Reassessment? Initial Assessment  Date Telepsych consult ordered in [facility]:   07/11/2020  Time Telepsych consult ordered in [facility]:  0820   Patient Reported Information Reviewed? Yes  Patient Left Without Being Seen? No data recorded Reason for Not Completing Assessment: No data recorded  Collateral Involvement: N/A   Does Patient Have a Court Appointed Legal Guardian? No data recorded Name and Contact of Legal Guardian: No data recorded If Minor and Not Living with Parent(s), Who has Custody? No data recorded Is CPS involved or ever been involved? Never  Is APS involved or ever been involved? Never   Patient Determined To Be At Risk for Harm To Self or Others Based on Review of Patient Reported Information or Presenting Complaint? No  Method: No data recorded Availability of Means: No data recorded Intent: No data recorded Notification Required: No data recorded Additional Information for Danger to Others Potential: No data recorded Additional Comments for Danger to Others Potential: No data recorded Are There Guns or Other Weapons in Your Home? No data recorded Types of Guns/Weapons: No data recorded Are These Weapons Safely Secured?                            No data recorded Who Could Verify You Are Able To Have These Secured: No data recorded Do You Have any Outstanding Charges, Pending Court Dates, Parole/Probation? No data recorded Contacted To Inform of Risk of Harm To Self or Others: No data recorded  Location of Assessment: [facility] ED   Does Patient Present under Involuntary Commitment? No  IVC Papers Initial File Date: No data recorded  [place] of Residence: [place]   Patient Currently Receiving the Following Services: Medication Management; Individual Therapy   Determination of Need: Routine (7 days)   Options For Referral: Outpatient Therapy; Medication Management     CCA Biopsychosocial Intake/Chief Complaint:  Patient presented to the ED with c/o worsening anxiety that became unmanagable, even after taking prescribed medication  for anxiety.  He reports passive SI.  Current Symptoms/Problems: See above   Patient Reported Schizophrenia/Schizoaffective Diagnosis in Past: No   Strengths: Motivated, has support  Preferences: No data recorded Abilities: No data recorded  Type of Services Patient Feels are Needed: Outpatient treatment, possible change in providers   Initial Clinical Notes/Concerns: No data recorded  Mental Health Symptoms Depression:  Fatigue; Hopelessness   Duration of Depressive symptoms: Less than two weeks   Mania:  None   Anxiety:   Worrying; Tension; Sleep; Restlessness; Fatigue   Psychosis:  None   Duration of Psychotic symptoms: No data recorded  Trauma:  None   Obsessions:  None   Compulsions:  None   Inattention:  None   Hyperactivity/Impulsivity:  N/A   Oppositional/Defiant Behaviors:  N/A   Emotional Irregularity:  None   Other Mood/Personality Symptoms:  No data recorded   Mental Status Exam Appearance and self-care  Stature:  Average   Weight:  Thin   Clothing:  Neat/clean   Grooming:  Normal   Cosmetic use:  None   Posture/gait:  Normal   Motor activity:  Not Remarkable   Sensorium  Attention:  Normal   Concentration:  Normal   Orientation:  Object; Person; Place; Situation; Time   Recall/memory:  Normal   Affect and Mood  Affect:  Anxious; Depressed   Mood:  Depressed   Relating  Eye contact:  Normal   Facial expression:  Responsive; Anxious   Attitude toward examiner:  Cooperative   Thought and Language  Speech flow: Clear and Coherent   Thought content:  Appropriate to Mood and Circumstances   Preoccupation:  None   Hallucinations:  None   Organization:  No data recorded  [employer] of Knowledge:  Average   Intelligence:  Average   Abstraction:  Normal   Judgement:  Fair   Reality Testing:  Adequate   Insight:  Fair   Decision Making:  Normal   Social Functioning  Social Maturity:   Responsible   Social Judgement:  Normal   Stress  Stressors:  Work   Coping Ability:  Programme researcher, broadcasting/film/video Deficits:  Responsibility; Self-care   Supports:  Friends/Service system; Family     Religion: Religion/Spirituality Are You A Religious Person?: No  Leisure/Recreation: Leisure / Recreation Do You Have Hobbies?: No  Exercise/Diet: Exercise/Diet Do You Exercise?: No Have You Gained or Lost A Significant Amount of Weight in the Past Six Months?: No Do You Follow a Special Diet?: No Do You Have Any Trouble Sleeping?: Yes Explanation of Sleeping Difficulties: sleeping only 4 hours per night for weeks   CCA Employment/Education Employment/Work Situation: Employment / Work Situation Employment situation: Employed 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, petroleum at [employer] long has patient been employed?: [employer] Patient's job has been impacted by current illness: No Has patient ever been in the [employer]?: No  Education: Education Is Patient Currently Attending School?: No Did Teacher, adult education From [employer]?: Yes Did You Have An Individualized Education Program (IIEP): No Did You Have Any Difficulty At [employer]?: No Patient's Education Has Been Impacted by Current Illness: No   CCA Family/Childhood History Family and Relationship History: Family history Marital status: Single What is your sexual orientation?: UTA Has your sexual activity been affected by drugs, alcohol, medication, or emotional stress?: UTA Does patient have children?:  (UTA)  Childhood History:  Childhood History Additional childhood history information: No concerns noted. Description of patient's relationship with caregiver when they were a child: UTA Patient's description of current relationship with people who raised him/her: UTA How were you disciplined when you got in trouble as a child/adolescent?: UTA Did patient suffer from severe childhood neglect?: No Has patient ever been  sexually abused/assaulted/raped as an adolescent or adult?: No Was the patient ever a victim of a crime or a disaster?: No Witnessed domestic violence?: No Has patient been affected by domestic violence as an adult?: No  Child/Adolescent Assessment:     CCA Substance Use Alcohol/Drug Use: Alcohol / Drug Use Pain Medications: See MAR Prescriptions: See MAR Over the Counter: See MAR History of alcohol / drug use?: No history of alcohol / drug abuse     ASAM's:  Six Dimensions of Multidimensional Assessment  Dimension 1:  Acute Intoxication and/or Withdrawal Potential:      Dimension 2:  Biomedical Conditions and Complications:      Dimension 3:  Emotional, Behavioral, or Cognitive Conditions and Complications:     Dimension 4:  Readiness to Change:     Dimension 5:  Relapse, Continued use, or Continued Problem Potential:     Dimension 6:  Recovery/Living Environment:  ASAM Severity Score:    ASAM Recommended Level of Treatment:     Substance use Disorder (SUD)    Recommendations for Services/Supports/Treatments:    DSM5 Diagnoses: Patient Active Problem List   Diagnosis Date Noted  . Generalized anxiety disorder 03/22/2014  . Breast cancer, right breast ([place]) 12/28/2013  . 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 Family history of malignant neoplasm of gastrointestinal tract 12/28/2013  . Family history of malignant neoplasm of breast 12/28/2013  . Breast cancer of lower-outer quadrant of right male breast ([place]) 12/24/2013    Patient Centered Plan: Patient is on the following Treatment Plan(s):  Anxiety and Depression   Referrals to Alternative Service(s): Patient to follow up with current providers.  Fransico Meadow, [facility]

## 2020-07-11 NOTE — BH Assessment (Addendum)
[facility] Assessment Progress Note  Per Shuvon Rankin, NP, this voluntary pt does not require psychiatric hospitalization at this time.  Pt is psychiatrically cleared.  Pt reportedly receives outpatient services from [name] currently, but he is dissatisfied with their services.  Discharge instructions advise pt to discuss conflicts with his current provider, but also offer contact information for [facility] as an alternative.  EDP Lavenia Atlas, DO and pt's nurse, Dekina, have been notified.  Jalene Mullet, [place] Triage Specialist [phone]

## 2020-07-14 ENCOUNTER — Ambulatory Visit (INDEPENDENT_AMBULATORY_CARE_PROVIDER_SITE_OTHER): Payer: BC Managed Care – PPO | Admitting: Nurse Practitioner

## 2020-07-14 ENCOUNTER — Other Ambulatory Visit: Payer: Self-pay

## 2020-07-14 ENCOUNTER — Encounter: Payer: Self-pay | Admitting: Nurse Practitioner

## 2020-07-14 VITALS — BP 175/76 | HR 78 | Temp 98.3°F | Ht 67.0 in | Wt 120.6 lb

## 2020-07-14 DIAGNOSIS — Z131 Encounter for screening for diabetes mellitus: Secondary | ICD-10-CM

## 2020-07-14 DIAGNOSIS — Z113 Encounter for screening for infections with a predominantly sexual mode of transmission: Secondary | ICD-10-CM | POA: Diagnosis not present

## 2020-07-14 DIAGNOSIS — F331 Major depressive disorder, recurrent, moderate: Secondary | ICD-10-CM | POA: Diagnosis not present

## 2020-07-14 DIAGNOSIS — C7490 Malignant neoplasm of unspecified part of unspecified adrenal gland: Secondary | ICD-10-CM | POA: Diagnosis not present

## 2020-07-14 DIAGNOSIS — Z7689 Persons encountering health services in other specified circumstances: Secondary | ICD-10-CM | POA: Diagnosis not present

## 2020-07-14 LAB — POCT URINALYSIS DIP (CLINITEK)
Bilirubin, UA: NEGATIVE
Blood, UA: NEGATIVE
Glucose, UA: NEGATIVE mg/dL
Ketones, POC UA: NEGATIVE mg/dL
Leukocytes, UA: NEGATIVE
Nitrite, UA: NEGATIVE
POC PROTEIN,UA: NEGATIVE
Spec Grav, UA: 1.025 (ref 1.010–1.025)
Urobilinogen, UA: 0.2 E.U./dL
pH, UA: 6 (ref 5.0–8.0)

## 2020-07-14 LAB — POCT CBG (FASTING - GLUCOSE)-MANUAL ENTRY: Glucose Fasting, POC: 112 mg/dL — AB (ref 70–99)

## 2020-07-14 NOTE — Patient Instructions (Signed)
Healthy Eating Following a healthy eating pattern may help you to achieve and maintain a healthy body weight, reduce the risk of chronic disease, and live a long and productive life. It is important to follow a healthy eating pattern at an appropriate calorie level for your body. Your nutritional needs should be met primarily through food by choosing a variety of nutrient-rich foods. What are tips for following this plan? Reading food labels  Read labels and choose the following: ? Reduced or low sodium. ? Juices with 100% fruit juice. ? Foods with low saturated fats and high polyunsaturated and monounsaturated fats. ? Foods with whole grains, such as whole wheat, cracked wheat, brown rice, and wild rice. ? Whole grains that are fortified with folic acid. This is recommended for women who are pregnant or who want to become pregnant.  Read labels and avoid the following: ? Foods with a lot of added sugars. These include foods that contain brown sugar, corn sweetener, corn syrup, dextrose, fructose, glucose, high-fructose corn syrup, honey, invert sugar, lactose, malt syrup, maltose, molasses, raw sugar, sucrose, trehalose, or turbinado sugar.  Do not eat more than the following amounts of added sugar per day:  6 teaspoons (25 g) for women.  9 teaspoons (38 g) for men. ? Foods that contain processed or refined starches and grains. ? Refined grain products, such as white flour, degermed cornmeal, white bread, and white rice. Shopping  Choose nutrient-rich snacks, such as vegetables, whole fruits, and nuts. Avoid high-calorie and high-sugar snacks, such as potato chips, fruit snacks, and candy.  Use oil-based dressings and spreads on foods instead of solid fats such as butter, stick margarine, or cream cheese.  Limit pre-made sauces, mixes, and "instant" products such as flavored rice, instant noodles, and ready-made pasta.  Try more plant-protein sources, such as tofu, tempeh, black beans,  edamame, lentils, nuts, and seeds.  Explore eating plans such as the Mediterranean diet or vegetarian diet. Cooking  Use oil to saut or stir-fry foods instead of solid fats such as butter, stick margarine, or lard.  Try baking, boiling, grilling, or broiling instead of frying.  Remove the fatty part of meats before cooking.  Steam vegetables in water or broth. Meal planning  At meals, imagine dividing your plate into fourths: ? One-half of your plate is fruits and vegetables. ? One-fourth of your plate is whole grains. ? One-fourth of your plate is protein, especially lean meats, poultry, eggs, tofu, beans, or nuts.  Include low-fat dairy as part of your daily diet.   Lifestyle  Choose healthy options in all settings, including home, work, school, restaurants, or stores.  Prepare your food safely: ? Wash your hands after handling raw meats. ? Keep food preparation surfaces clean by regularly washing with hot, soapy water. ? Keep raw meats separate from ready-to-eat foods, such as fruits and vegetables. ? Cook seafood, meat, poultry, and eggs to the recommended internal temperature. ? Store foods at safe temperatures. In general:  Keep cold foods at 7F (4.4C) or below.  Keep hot foods at 17F (60C) or above.  Keep your freezer at [facility] (-17.8C) or below.  Foods are no longer safe to eat when they have been between the temperatures of 40-17F (4.4-60C) for more than 2 hours. What foods should I eat? Fruits Aim to eat 2 cup-equivalents of fresh, canned (in natural juice), or frozen fruits each day. Examples of 1 cup-equivalent of fruit include 1 small apple, 8 large strawberries, 1 cup canned fruit,  cup dried fruit, or 1 cup 100% juice. Vegetables Aim to eat 2-3 cup-equivalents of fresh and frozen vegetables each day, including different varieties and colors. Examples of 1 cup-equivalent of vegetables include 2 medium carrots, 2 cups raw, leafy greens, 1 cup chopped  vegetable (raw or cooked), or 1 medium baked potato. Grains Aim to eat 6 ounce-equivalents of whole grains each day. Examples of 1 ounce-equivalent of grains include 1 slice of bread, 1 cup ready-to-eat cereal, 3 cups popcorn, or  cup cooked rice, pasta, or cereal. Meats and other proteins Aim to eat 5-6 ounce-equivalents of protein each day. Examples of 1 ounce-equivalent of protein include 1 egg, 1/2 cup nuts or seeds, or 1 tablespoon (16 g) peanut butter. A cut of meat or fish that is the size of a deck of cards is about 3-4 ounce-equivalents.  Of the protein you eat each week, try to have at least 8 ounces come from seafood. This includes salmon, trout, herring, and anchovies. Dairy Aim to eat 3 cup-equivalents of fat-free or low-fat dairy each day. Examples of 1 cup-equivalent of dairy include 1 cup (240 mL) milk, 8 ounces (250 g) yogurt, 1 ounces (44 g) natural cheese, or 1 cup (240 mL) fortified soy milk. Fats and oils  Aim for about 5 teaspoons (21 g) per day. Choose monounsaturated fats, such as canola and olive oils, avocados, peanut butter, and most nuts, or polyunsaturated fats, such as sunflower, corn, and soybean oils, walnuts, pine nuts, sesame seeds, sunflower seeds, and flaxseed. Beverages  Aim for six 8-oz glasses of water per day. Limit coffee to three to five 8-oz cups per day.  Limit caffeinated beverages that have added calories, such as soda and energy drinks.  Limit alcohol intake to no more than 1 drink a day for nonpregnant women and 2 drinks a day for men. One drink equals 12 oz of beer (355 mL), 5 oz of wine (148 mL), or 1 oz of hard liquor (44 mL). Seasoning and other foods  Avoid adding excess amounts of salt to your foods. Try flavoring foods with herbs and spices instead of salt.  Avoid adding sugar to foods.  Try using oil-based dressings, sauces, and spreads instead of solid fats. This information is based on general [place] nutrition guidelines. For more  information, visit choosemyplate.gov. Exact amounts may vary based on your nutrition needs. Summary  A healthy eating plan may help you to maintain a healthy weight, reduce the risk of chronic diseases, and stay active throughout your life.  Plan your meals. Make sure you eat the right portions of a variety of nutrient-rich foods.  Try baking, boiling, grilling, or broiling instead of frying.  Choose healthy options in all settings, including home, work, school, restaurants, or stores. This information is not intended to replace advice given to you by your health care provider. Make sure you discuss any questions you have with your health care provider. Document Revised: 08/26/2017 Document Reviewed: 08/26/2017 Elsevier Patient Education  2021 Elsevier Inc.  

## 2020-07-14 NOTE — Progress Notes (Signed)
[address], [place]  [id number] Phone:  [phone]   Fax:  [phone]   New Patient Office Visit  Subjective:  Patient ID: Philip Marquez, male    DOB: [birth year]  Age: 30 y.o. MRN: [id number]  CC:  Chief Complaint  Patient presents with  . New Patient (Initial Visit)    Est care , no  concerns     HPI Philip Marquez presents to establish care. 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   He is currently follow up by [facility] oncology for his cancers.  He admits that his cancers were found early.  There is a significant maternal history for cancer.  He is not aware of any genetic testing with his family.  He does have 2 siblings.  He does currently have some problems with depression and anxiety.  He is currently on propranolol 10 mg 1 to 2 tablets as needed for anxiety.  He admits that he does use this which does seem to be effective.  He does have mirtazapine 10 mg which he uses occasionally for sleep.  He admits that he does have some sleep disturbances related to anxiety that developed after lying back straight in bed where he experiences some dizziness and pressure.  He admits that it only lasts a few moments once he repositions.  He has been followed by psychiatry and a counselor.  He has not seen his counselor in several months due to his work schedule.  He admits that he was a patient at [facility] however felt like things were somewhat robotic.  He works full-time and has a desire to work in Water engineer.  This is what he has a college degree in.  He admits that he does have decreased appetite due to his depression.  No current suicidal ideations.  Past Medical History:  Diagnosis Date  . Breast cancer ([place])   . 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 Skin cancer   . Status post autologous bone marrow transplant [facility])      Past Surgical History:  Procedure Laterality Date  . ADRENALECTOMY    . MASTECTOMY      Family History  Problem Relation Age of Onset  . Cancer Maternal Grandfather 24       colorectal  . Cancer Other 25       mat great uncle (through [facility]) with possible neuroblastoma  . Cancer Other 15       male mat first cousin once removed with breast cancer (related through MGF's sister)    Social History   Socioeconomic History  . Marital status: Single    Spouse name: Not on file  . Number of children: Not on file  . Years of education: Not on file  . Highest education level: Not on file  Occupational History  . Not on file  Tobacco Use  . Smoking status: Never Smoker  . Smokeless tobacco: Never Used  Substance and Sexual Activity  . Alcohol use: Yes    Comment: occ  . Drug use: Yes    Types: Marijuana  . Sexual activity: Not on file  Other Topics Concern  . Not on file  Social History Narrative  . Not on file   Social Determinants of Health   Financial Resource Strain: Not on file  Food Insecurity: Not on file  Transportation Needs: Not on file  Physical Activity: Not on file  Stress: Not on file  Social Connections: Not on file  Intimate Partner Violence: Not on file    ROS Review of Systems  Constitutional:       Heavies 145 and 120 the last few year.    Respiratory: Negative for shortness of breath and wheezing.   Cardiovascular: Negative for chest pain.  Gastrointestinal: Negative for nausea and vomiting.  Genitourinary: Positive for frequency.       Strain  Musculoskeletal: Positive for joint swelling (knee).  Allergic/Immunologic: Positive for environmental allergies (pollen).  Neurological: Positive for dizziness.       Pressure on the back of the head does make him dizziness.  He has noticed this on the right    Eye appt has been a while.   Hematological: Negative.   Psychiatric/Behavioral: Positive for sleep disturbance (does have some  problems falling asleep but rest well when . He does not feel as rested the next day. He does lay down for at 10.  ).       Anxiety does resolve with position changes.   He admits that work got hectic. He has not spoken with his counsel since the end of last year.     Objective:   Today's Vitals: BP (!) 175/76 (BP Location: Left Arm, Patient Position: Sitting, Cuff Size: Normal)   Pulse 78   Temp 98.3 F (36.8 C) (Temporal)   Ht 5\' 7"  (1.702 m)   Wt 120 lb 9.6 oz (54.7 kg)   SpO2 99%   BMI 18.89 kg/m   Physical Exam Constitutional:      General: He is not in acute distress.    Appearance: He is not ill-appearing, toxic-appearing or diaphoretic.  HENT:     Head: Normocephalic and atraumatic.     Nose: Nose normal.     Mouth/Throat:     Mouth: Mucous membranes are moist.  Cardiovascular:     Rate and Rhythm: Normal rate and regular rhythm.     Pulses: Normal pulses.     Heart sounds: Normal heart sounds.  Pulmonary:     Effort: Pulmonary effort is normal.     Breath sounds: Normal breath sounds.  Abdominal:     Palpations: Abdomen is soft.     Comments: Hypoactive  Musculoskeletal:        General: Normal range of motion.     Cervical back: Tenderness present.  Skin:    General: Skin is warm and dry.     Capillary Refill: Capillary refill takes less than 2 seconds.  Neurological:     General: No focal deficit present.     Mental Status: He is alert and oriented to person, place, and time.  Psychiatric:        Mood and Affect: Mood normal.        Behavior: Behavior normal.        Thought Content: Thought content normal.        Judgment: Judgment normal.     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 Referral placed for possible new counseling and psychiatry with the [facility] services Do discuss habit changes   Relevant Orders  Ambulatory referral to Psychiatry    Other Visit Diagnoses    Encounter to establish care    -  Primary Discussed male health maintenance; self exams of breast and testicles  and annual exams. Discussed prostate age related concerns Discussed general safety in vehicle and COVID Discussed regular hydration with water Discussed healthy diet and exercise and weight management Discussed sexual health  Discussed mental health Encouraged to call our office for an appointment with in ongoing concerns for questions.      Relevant Orders   Comp. Metabolic Panel (12)   Screening for diabetes mellitus       Relevant Orders   Comp. Metabolic Panel (12)   POCT URINALYSIS DIP (CLINITEK) (Completed)   POCT CBG (Fasting - Glucose) (Completed)   Screening for STD (sexually transmitted disease)       Relevant Orders   STD Screen (8)      Outpatient Encounter Medications as of 07/14/2020  Medication Sig  . BIOTIN PO Take 1 tablet by mouth daily.  . COLLAGEN PO Take 1 tablet by mouth daily.  . mirtazapine (REMERON) 15 MG tablet Take 1 tablet (15 mg total) by mouth at bedtime.  . Multiple Vitamin (MULTIVITAMIN WITH MINERALS) TABS tablet Take 1 tablet by mouth daily.  . propranolol (INDERAL) 10 MG tablet Take 1-2 tabs po BID prn anxiety (Patient taking differently: Take 10-20 mg by mouth 2 (two) times daily as needed (anxiety).)  . Dihydrotachysterol (DHT PO) Take 1 tablet by mouth daily. (Patient not taking: Reported on 07/14/2020)  . [DISCONTINUED] aspirin EC 325 MG tablet Take 325 mg by mouth every 4 (four) hours as needed for mild pain or fever.   No facility-administered encounter medications on file as of 07/14/2020.    Follow-up: Return in about 3 months (around 10/11/2020).   Vevelyn Francois, NP

## 2020-07-15 LAB — COMP. METABOLIC PANEL (12)
AST: 20 IU/L (ref 0–40)
Albumin/Globulin Ratio: 2.2 (ref 1.2–2.2)
Albumin: 4.9 g/dL (ref 4.1–5.2)
Alkaline Phosphatase: 58 IU/L (ref 44–121)
BUN/Creatinine Ratio: 11 (ref 9–20)
BUN: 11 mg/dL (ref 6–20)
Bilirubin Total: 0.4 mg/dL (ref 0.0–1.2)
Calcium: 9.7 mg/dL (ref 8.7–10.2)
Chloride: 100 mmol/L (ref 96–106)
Creatinine, Ser: 0.97 mg/dL (ref 0.76–1.27)
GFR calc Af Amer: 121 mL/min/{1.73_m2} (ref >59)
GFR calc non Af Amer: 105 mL/min/{1.73_m2} (ref >59)
Globulin, Total: 2.2 g/dL (ref 1.5–4.5)
Glucose: 101 mg/dL — ABNORMAL HIGH (ref 65–99)
Potassium: 4.3 mmol/L (ref 3.5–5.2)
Sodium: 141 mmol/L (ref 134–144)
Total Protein: 7.1 g/dL (ref 6.0–8.5)

## 2020-07-15 LAB — STD SCREEN (8)
HIV Screen 4th Generation wRfx: NONREACTIVE
HSV 1 Glycoprotein G Ab, IgG: <0.91 index (ref 0.00–0.90)
HSV 2 IgG, Type Spec: <0.91 index (ref 0.00–0.90)
Hep A IgM: NEGATIVE
Hep B C IgM: NEGATIVE
Hep C Virus Ab: <0.1 s/co ratio (ref 0.0–0.9)
Hepatitis B Surface Ag: NEGATIVE
RPR Ser Ql: NONREACTIVE

## 2020-07-28 ENCOUNTER — Ambulatory Visit: Payer: BC Managed Care – PPO | Admitting: Mental Health

## 2020-08-02 ENCOUNTER — Other Ambulatory Visit: Payer: Self-pay | Admitting: Psychiatry

## 2020-08-02 DIAGNOSIS — F410 Panic disorder [episodic paroxysmal anxiety] without agoraphobia: Secondary | ICD-10-CM

## 2020-08-09 ENCOUNTER — Ambulatory Visit: Payer: BC Managed Care – PPO | Admitting: Adult Health

## 2020-08-23 DIAGNOSIS — F411 Generalized anxiety disorder: Secondary | ICD-10-CM | POA: Diagnosis not present

## 2020-09-08 DIAGNOSIS — F411 Generalized anxiety disorder: Secondary | ICD-10-CM | POA: Diagnosis not present

## 2020-09-26 ENCOUNTER — Other Ambulatory Visit: Payer: Self-pay

## 2020-09-26 ENCOUNTER — Emergency Department (HOSPITAL_BASED_OUTPATIENT_CLINIC_OR_DEPARTMENT_OTHER)
Admission: EM | Admit: 2020-09-26 | Discharge: 2020-09-27 | Disposition: A | Discharge status: Home / Self Care | Payer: BC Managed Care – PPO | Attending: Emergency Medicine | Admitting: Emergency Medicine

## 2020-09-26 ENCOUNTER — Emergency Department (HOSPITAL_BASED_OUTPATIENT_CLINIC_OR_DEPARTMENT_OTHER): Payer: BC Managed Care – PPO

## 2020-09-26 ENCOUNTER — Encounter (HOSPITAL_BASED_OUTPATIENT_CLINIC_OR_DEPARTMENT_OTHER): Payer: Self-pay | Admitting: *Deleted

## 2020-09-26 DIAGNOSIS — Z853 Personal history of malignant neoplasm of breast: Secondary | ICD-10-CM | POA: Insufficient documentation

## 2020-09-26 DIAGNOSIS — R079 Chest pain, unspecified: Secondary | ICD-10-CM | POA: Diagnosis not present

## 2020-09-26 DIAGNOSIS — F410 Panic disorder [episodic paroxysmal anxiety] without agoraphobia: Secondary | ICD-10-CM | POA: Insufficient documentation

## 2020-09-26 DIAGNOSIS — F419 Anxiety disorder, unspecified: Secondary | ICD-10-CM

## 2020-09-26 DIAGNOSIS — R0789 Other chest pain: Secondary | ICD-10-CM | POA: Diagnosis not present

## 2020-09-26 DIAGNOSIS — R072 Precordial pain: Secondary | ICD-10-CM | POA: Diagnosis not present

## 2020-09-26 HISTORY — DX: Panic disorder (episodic paroxysmal anxiety): F41.0

## 2020-09-26 MED ORDER — KETOROLAC TROMETHAMINE 15 MG/ML IJ SOLN
15.0000 mg | Freq: Once | INTRAMUSCULAR | Status: AC
  Administered 2020-09-26: 15 mg via INTRAVENOUS
  Filled 2020-09-26: qty 1

## 2020-09-26 NOTE — ED Triage Notes (Signed)
States he had a "really" bad panic attack followed by chest pain.

## 2020-09-26 NOTE — ED Provider Notes (Signed)
[facility] EMERGENCY DEPARTMENT Provider Note   CSN: [id number] Arrival date & time: 09/26/20  2245     History Chief Complaint  Patient presents with  . Chest Pain  . Panic Attack    Philip Marquez is a 30 y.o. male.  HPI     This is a 30 year old male with a history of multiple cancers including neuroblastoma, breast cancer, skin cancer and anxiety who presents with panic attack.  Patient reports he has multiple life stressors and his general health makes him have multiple panic attacks.  He states anytime he thinks about his health, he will have a panic attack.  Tonight he had "a really bad" panic attack and subsequently developed some sharp chest pains.  He will occasionally get some chest pain but it will self resolve.  He states that this was worse and lasted approximately 30 minutes.  It is now gone but it concerned him.  He has not had any recent illnesses, fevers, shortness of breath.  No lower extremity swelling or history of blood clots.  He did not take anything for his pain.  He does not take anything for his anxiety because "I try to avoid that."  Past Medical History:  Diagnosis Date  . Breast cancer ([place])   . 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 Panic attacks   . Skin cancer   . Status post autologous bone marrow transplant [facility])     Patient Active Problem List   Diagnosis Date Noted  . Major depressive disorder, recurrent episode, moderate ([place]) 07/14/2020  . Generalized anxiety disorder 03/22/2014  . Breast cancer, right breast ([place]) 12/28/2013  . 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 Family history of malignant neoplasm of gastrointestinal tract 12/28/2013  . Family history of malignant neoplasm of breast 12/28/2013  . Breast cancer of lower-outer quadrant of right male breast ([place]) 12/24/2013    Past Surgical History:  Procedure Laterality Date  . ADRENALECTOMY    . MASTECTOMY         Family History  Problem Relation  Age of Onset  . Cancer Maternal Grandfather 48       colorectal  . Cancer Other 25       mat great uncle (through [facility]) with possible neuroblastoma  . Cancer Other 12       male mat first cousin once removed with breast cancer (related through MGF's sister)    Social History   Tobacco Use  . Smoking status: Never Smoker  . Smokeless tobacco: Never Used  Substance Use Topics  . Alcohol use: Yes    Comment: occ  . Drug use: Yes    Types: Marijuana    Home Medications Prior to Admission medications   Medication Sig Start Date End Date Taking? Authorizing Provider  BIOTIN PO Take 1 tablet by mouth daily.   Yes [provider]  COLLAGEN PO Take 1 tablet by mouth daily.   Yes [provider]  Dihydrotachysterol (DHT PO) Take 1 tablet by mouth daily.   Yes [provider]  Multiple Vitamin (MULTIVITAMIN WITH MINERALS) TABS tablet Take 1 tablet by mouth daily.   Yes [provider]  mirtazapine (REMERON) 15 MG tablet Take 1 tablet (15 mg total) by mouth at bedtime. 07/11/20   Thayer Headings, PMHNP  propranolol (INDERAL) 10 MG tablet Take 1-2 tabs po BID prn anxiety Patient taking differently: Take 10-20 mg by mouth 2 (two) times daily as needed (anxiety). 04/29/20  Moz[place], Berdie Ogren, NP    Allergies    Bactrim [sulfamethoxazole-trimethoprim]  Review of Systems   Review of Systems  Constitutional: Negative for fever.  Respiratory: Negative for shortness of breath.   Cardiovascular: Positive for chest pain. Negative for leg swelling.  Gastrointestinal: Negative for abdominal pain, nausea and vomiting.  Psychiatric/Behavioral: The patient is nervous/anxious.   All other systems reviewed and are negative.   Physical Exam Updated Vital Signs BP 126/85   Pulse 78   Temp 98.4 F (36.9 C) (Oral)   Resp 19   Ht 1.702 m (5\' 7" )   Wt 54.1 kg   SpO2 99%   BMI 18.68 kg/m   Physical Exam Vitals and nursing note reviewed.   Constitutional:      Appearance: He is well-developed. He is not ill-appearing.  HENT:     Head: Normocephalic and atraumatic.  Eyes:     Pupils: Pupils are equal, round, and reactive to light.  Cardiovascular:     Rate and Rhythm: Normal rate and regular rhythm.     Heart sounds: Normal heart sounds. No murmur heard.   Pulmonary:     Effort: Pulmonary effort is normal. No respiratory distress.     Breath sounds: Normal breath sounds. No wheezing.  Chest:     Chest wall: Tenderness present.  Abdominal:     General: Bowel sounds are normal.     Palpations: Abdomen is soft.     Tenderness: There is no abdominal tenderness. There is no rebound.  Musculoskeletal:     Cervical back: Neck supple.     Right lower leg: No tenderness. No edema.     Left lower leg: No tenderness. No edema.  Skin:    General: Skin is warm and dry.  Neurological:     Mental Status: He is alert and oriented to person, place, and time.  Psychiatric:        Mood and Affect: Mood is anxious.     ED Results / Procedures / Treatments   Labs (all labs ordered are listed, but only abnormal results are displayed) Labs Reviewed  BASIC METABOLIC PANEL - Abnormal; Notable for the following components:      Result Value   Potassium 3.4 (*)    Glucose, Bld 110 (*)    All other components within normal limits  D-DIMER, QUANTITATIVE  TROPONIN I (HIGH SENSITIVITY)  TROPONIN I (HIGH SENSITIVITY)    EKG EKG Interpretation  Date/Time:  Monday Sep 26 2020 22:53:00 EDT Ventricular Rate:  96 PR Interval:  112 QRS Duration: 72 QT Interval:  338 QTC Calculation: 427 R Axis:   70 Text Interpretation: Normal sinus rhythm Rate faster Otherwise no significant change Confirmed by Thayer Jew [phone]) on 09/26/2020 11:28:29 PM   Radiology DG Chest 2 View  Result Date: 09/26/2020 CLINICAL DATA:  Panic attack followed by chest pain EXAM: CHEST - 2 VIEW COMPARISON:  Radiograph 07/01/2016 FINDINGS: Surgical clips  in the right axilla, unchanged from prior. Telemetry leads overlie the chest. Additional surgical clips seen in the right upper quadrant. No consolidation, features of edema, pneumothorax, or effusion. Pulmonary vascularity is normally distributed. The cardiomediastinal contours are unremarkable. No acute osseous or soft tissue abnormality. IMPRESSION: No acute cardiopulmonary abnormality. Electronically Signed   By: Lovena Le M.D.   On: 09/26/2020 23:29    Procedures Procedures   Medications Ordered in ED Medications  ketorolac (TORADOL) 15 MG/ML injection 15 mg (15 mg Intravenous Given 09/26/20 2344)    ED  Course  I have reviewed the triage vital signs and the nursing notes.  Pertinent labs & imaging results that were available during my care of the patient were reviewed by me and considered in my medical decision making (see chart for details).    MDM Rules/Calculators/A&P                          Patient presents with chest pain in the setting of anxiety.  He is nontoxic-appearing.  He is afebrile and vital signs are reassuring.  He states discomfort has subsided but it concerned him.  He has a significant history of anxiety attacks.  He is very low risk for ACS.  EKG without ischemic changes or arrhythmia.  Troponin is 3.  Do not feel he needs a delta Trop as I have low suspicion for primary ACS.  D-dimer was sent to her stratify for PE.  D-dimer is also reassuring.  Metabolic panel without acute metabolic derangements.  Chest x-ray without evidence of pneumothorax or pneumonia.  He does have some reproducible pain on exam which is suggestive of musculoskeletal etiology.  He was given Toradol.  Overall chest pain work-up is reassuring and doubt acute emergent process.  Encouraged to follow-up with his primary physician regarding ongoing management of his anxiety.  After history, exam, and medical workup I feel the patient has been appropriately medically screened and is safe for discharge  home. Pertinent diagnoses were discussed with the patient. Patient was given return precautions.  Final Clinical Impression(s) / ED Diagnoses Final diagnoses:  Atypical chest pain  Anxiety    Rx / DC Orders ED Discharge Orders    None       Kindred Reidinger, Barbette Hair, MD 09/27/20 [phone]

## 2020-09-27 LAB — TROPONIN I (HIGH SENSITIVITY): Troponin I (High Sensitivity): 3 ng/L (ref <18)

## 2020-09-27 LAB — BASIC METABOLIC PANEL
Anion gap: 9 (ref 5–15)
BUN: 10 mg/dL (ref 6–20)
CO2: 27 mmol/L (ref 22–32)
Calcium: 9 mg/dL (ref 8.9–10.3)
Chloride: 103 mmol/L (ref 98–111)
Creatinine, Ser: 0.92 mg/dL (ref 0.61–1.24)
GFR, Estimated: >60 mL/min (ref >60)
Glucose, Bld: 110 mg/dL — ABNORMAL HIGH (ref 70–99)
Potassium: 3.4 mmol/L — ABNORMAL LOW (ref 3.5–5.1)
Sodium: 139 mmol/L (ref 135–145)

## 2020-09-27 LAB — D-DIMER, QUANTITATIVE: D-Dimer, Quant: 0.36 ug/mL-FEU (ref 0.00–0.50)

## 2020-09-27 NOTE — Discharge Instructions (Addendum)
Your work-up today is reassuring.  Follow-up closely with your primary physician to manage ongoing issues with anxiety.

## 2020-10-13 ENCOUNTER — Encounter: Payer: Self-pay | Admitting: Nurse Practitioner

## 2020-10-13 ENCOUNTER — Other Ambulatory Visit: Payer: Self-pay

## 2020-10-13 ENCOUNTER — Ambulatory Visit (INDEPENDENT_AMBULATORY_CARE_PROVIDER_SITE_OTHER): Payer: BC Managed Care – PPO | Admitting: Nurse Practitioner

## 2020-10-13 VITALS — BP 122/77 | HR 72 | Temp 98.1°F | Ht 67.0 in | Wt 117.0 lb

## 2020-10-13 DIAGNOSIS — F411 Generalized anxiety disorder: Secondary | ICD-10-CM

## 2020-10-13 DIAGNOSIS — C7490 Malignant neoplasm of unspecified part of unspecified adrenal gland: Secondary | ICD-10-CM

## 2020-10-13 DIAGNOSIS — F331 Major depressive disorder, recurrent, moderate: Secondary | ICD-10-CM | POA: Diagnosis not present

## 2020-10-13 DIAGNOSIS — C50921 Malignant neoplasm of unspecified site of right male breast: Secondary | ICD-10-CM | POA: Diagnosis not present

## 2020-10-13 NOTE — Progress Notes (Signed)
Philip Marquez, [place]  [address] Phone:  [phone]   Fax:  [phone]   Established Patient Office Visit  Subjective:  Patient ID: Philip Marquez, male    DOB: 1990-07-13  Age: 30 y.o. MRN: [id number]  CC:  Chief Complaint  Patient presents with  . Follow-up    3 month follow up, no questions or concerns.     HPI Philip Marquez presents for follow up. 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   He is in today for follow up. This is just for general surveillance due to his history and being a new patient. He reports that he is doing well overall. He continues to work full time and has just purchased his first home. He reports that there are some financial strains that comes along with this. He does not always a food in the home. He BMI is slightly below normal. He also has major depression which is due to history . He was to start Mirtazapine and propanolol however he has not started either of these medications. He was to establish care with an outside counselor. He does have family support; parents and siblings whom are local.   Past Medical History:  Diagnosis Date  . Breast cancer ([place])   . 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 Panic attacks   . Skin cancer   . Status post autologous bone marrow transplant [facility])     Past Surgical History:  Procedure Laterality Date  . ADRENALECTOMY    . MASTECTOMY      Family History  Problem Relation Age of Onset  . Cancer Maternal Grandfather 18       colorectal  . Cancer Other 25       mat great uncle (through [facility]) with possible neuroblastoma  . Cancer Other 59       male mat first cousin once removed with breast cancer (related through MGF's sister)    Social History   Socioeconomic History  . Marital status: Single    Spouse name: Not on  file  . Number of children: Not on file  . Years of education: Not on file  . Highest education level: Not on file  Occupational History  . Not on file  Tobacco Use  . Smoking status: Never Smoker  . Smokeless tobacco: Never Used  Substance and Sexual Activity  . Alcohol use: Yes    Comment: occ  . Drug use: Yes    Types: Marijuana  . Sexual activity: Not on file  Other Topics Concern  . Not on file  Social History Narrative  . Not on file   Social Determinants of Health   Financial Resource Strain: Not on file  Food Insecurity: Not on file  Transportation Needs: Not on file  Physical Activity: Not on file  Stress: Not on file  Social Connections: Not on file  Intimate Partner Violence: Not on file    Outpatient Medications Prior to Visit  Medication Sig Dispense Refill  . BIOTIN PO Take 1 tablet by mouth daily.    . COLLAGEN PO Take 1 tablet by mouth daily.    . Dihydrotachysterol (DHT PO) Take 1 tablet by mouth daily.    . Multiple Vitamin (MULTIVITAMIN WITH MINERALS) TABS tablet Take 1 tablet by mouth daily.    Marland Kitchen  mirtazapine (REMERON) 15 MG tablet Take 1 tablet (15 mg total) by mouth at bedtime. (Patient not taking: Reported on 10/13/2020) 30 tablet 0  . propranolol (INDERAL) 10 MG tablet Take 1-2 tabs po BID prn anxiety (Patient not taking: Reported on 10/13/2020) 120 tablet 2   No facility-administered medications prior to visit.    Allergies  Allergen Reactions  . Bactrim [Sulfamethoxazole-Trimethoprim] Itching    ROS Review of Systems    Objective:    Physical Exam Constitutional:      General: He is not in acute distress.    Appearance: He is not ill-appearing, toxic-appearing or diaphoretic.  HENT:     Head: Normocephalic and atraumatic.     Nose: Nose normal.     Mouth/Throat:     Mouth: Mucous membranes are moist.  Cardiovascular:     Rate and Rhythm: Normal rate and regular rhythm.     Pulses: Normal pulses.     Heart sounds: Normal heart  sounds.  Pulmonary:     Effort: Pulmonary effort is normal.     Breath sounds: Normal breath sounds.  Abdominal:     Palpations: Abdomen is soft.  Musculoskeletal:        General: Normal range of motion.     Cervical back: Normal range of motion.  Skin:    General: Skin is warm and dry.     Capillary Refill: Capillary refill takes less than 2 seconds.  Neurological:     General: No focal deficit present.     Mental Status: He is alert and oriented to person, place, and time.  Psychiatric:        Mood and Affect: Mood normal.        Behavior: Behavior normal.        Thought Content: Thought content normal.        Judgment: Judgment normal.     BP 122/77 (BP Location: Right Arm)   Pulse 72   Temp 98.1 F (36.7 C)   Ht 5\' 7"  (1.702 m)   Wt 117 lb (53.1 kg)   BMI 18.32 kg/m  Wt Readings from Last 3 Encounters:  10/13/20 117 lb (53.1 kg)  09/26/20 119 lb 4.3 oz (54.1 kg)  07/14/20 120 lb 9.6 oz (54.7 kg)     Health Maintenance Due  Topic Date Due  . COVID-19 Vaccine (3 - Moderna risk 4-dose series) 10/20/2019    There are no preventive care reminders to display for this patient.  Lab Results  Component Value Date   TSH 1.944 02/07/2010   Lab Results  Component Value Date   WBC 6.1 07/11/2020   HGB 13.2 07/11/2020   HCT 37.9 (L) 07/11/2020   MCV 87.7 07/11/2020   PLT 210 07/11/2020   Lab Results  Component Value Date   NA 139 09/26/2020   K 3.4 (L) 09/26/2020   CHLORIDE 102 12/28/2013   CO2 27 09/26/2020   GLUCOSE 110 (H) 09/26/2020   BUN 10 09/26/2020   CREATININE 0.92 09/26/2020   BILITOT 0.4 07/14/2020   ALKPHOS 58 07/14/2020   AST 20 07/14/2020   ALT 20 07/11/2020   PROT 7.1 07/14/2020   ALBUMIN 4.9 07/14/2020   CALCIUM 9.0 09/26/2020   ANIONGAP 9 09/26/2020   No results found for: CHOL No results found for: HDL No results found for: LDLCALC No results found for: TRIG No results found for: CHOLHDL No results found for: HGBA1C     Assessment & Plan:   Problem List Items  Addressed This Visit      Endocrine   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   Follow-up: Return in about 3 months (around 01/13/2021).    Vevelyn Francois, NP

## 2020-10-13 NOTE — Patient Instructions (Signed)
Managing Anxiety, Adult After being diagnosed with an anxiety disorder, you may be relieved to know why you have felt or behaved a certain way. You may also feel overwhelmed about the treatment ahead and what it will mean for your life. With care and support, you can manage this condition and recover from it. How to manage lifestyle changes Managing stress and anxiety Stress is your body's reaction to life changes and events, both good and bad. Most stress will last just a few hours, but stress can be ongoing and can lead to more than just stress. Although stress can play a major role in anxiety, it is not the same as anxiety. Stress is usually caused by something external, such as a deadline, test, or competition. Stress normally passes after the triggering event has ended.  Anxiety is caused by something internal, such as imagining a terrible outcome or worrying that something will go wrong that will devastate you. Anxiety often does not go away even after the triggering event is over, and it can become long-term (chronic) worry. It is important to understand the differences between stress and anxiety and to manage your stress effectively so that it does not lead to an anxious response. Talk with your health care provider or a counselor to learn more about reducing anxiety and stress. He or she may suggest tension reduction techniques, such as:  Music therapy. This can include creating or listening to music that you enjoy and that inspires you.  Mindfulness-based meditation. This involves being aware of your normal breaths while not trying to control your breathing. It can be done while sitting or walking.  Centering prayer. This involves focusing on a word, phrase, or sacred image that means something to you and brings you peace.  Deep breathing. To do this, expand your stomach and inhale slowly through your nose. Hold your breath for 3-5 seconds. Then exhale slowly, letting your stomach muscles  relax.  Self-talk. This involves identifying thought patterns that lead to anxiety reactions and changing those patterns.  Muscle relaxation. This involves tensing muscles and then relaxing them. Choose a tension reduction technique that suits your lifestyle and personality. These techniques take time and practice. Set aside 5-15 minutes a day to do them. Therapists can offer counseling and training in these techniques. The training to help with anxiety may be covered by some insurance plans. Other things you can do to manage stress and anxiety include:  Keeping a stress/anxiety diary. This can help you learn what triggers your reaction and then learn ways to manage your response.  Thinking about how you react to certain situations. You may not be able to control everything, but you can control your response.  Making time for activities that help you relax and not feeling guilty about spending your time in this way.  Visual imagery and yoga can help you stay calm and relax.   Medicines Medicines can help ease symptoms. Medicines for anxiety include:  Anti-anxiety drugs.  Antidepressants. Medicines are often used as a primary treatment for anxiety disorder. Medicines will be prescribed by a health care provider. When used together, medicines, psychotherapy, and tension reduction techniques may be the most effective treatment. Relationships Relationships can play a big part in helping you recover. Try to spend more time connecting with trusted friends and family members. Consider going to couples counseling, taking family education classes, or going to family therapy. Therapy can help you and others better understand your condition. How to recognize changes in your   anxiety Everyone responds differently to treatment for anxiety. Recovery from anxiety happens when symptoms decrease and stop interfering with your daily activities at home or work. This may mean that you will start to:  Have  better concentration and focus. Worry will interfere less in your daily thinking.  Sleep better.  Be less irritable.  Have more energy.  Have improved memory. It is important to recognize when your condition is getting worse. Contact your health care provider if your symptoms interfere with home or work and you feel like your condition is not improving. Follow these instructions at home: Activity  Exercise. Most adults should do the following: ? Exercise for at least 150 minutes each week. The exercise should increase your heart rate and make you sweat (moderate-intensity exercise). ? Strengthening exercises at least twice a week.  Get the right amount and quality of sleep. Most adults need 7-9 hours of sleep each night. Lifestyle  Eat a healthy diet that includes plenty of vegetables, fruits, whole grains, low-fat dairy products, and lean protein. Do not eat a lot of foods that are high in solid fats, added sugars, or salt.  Make choices that simplify your life.  Do not use any products that contain nicotine or tobacco, such as cigarettes, e-cigarettes, and chewing tobacco. If you need help quitting, ask your health care provider.  Avoid caffeine, alcohol, and certain over-the-counter cold medicines. These may make you feel worse. Ask your pharmacist which medicines to avoid.   General instructions  Take over-the-counter and prescription medicines only as told by your health care provider.  Keep all follow-up visits as told by your health care provider. This is important. Where to find support You can get help and support from these sources:  Self-help groups.  Online and [employer].  A trusted spiritual leader.  Couples counseling.  Family education classes.  Family therapy. Where to find more information You may find that joining a support group helps you deal with your anxiety. The following sources can help you locate counselors or support groups near  you:  [place]: www.mentalhealthamerica.net  Anxiety and Depression Association of [place] (ADAA): https://www.clark.net/  National Alliance on Mental Illness (NAMI): www.nami.org Contact a health care provider if you:  Have a hard time staying focused or finishing daily tasks.  Spend many hours a day feeling worried about everyday life.  Become exhausted by worry.  Start to have headaches, feel tense, or have nausea.  Urinate more than normal.  Have diarrhea. Get help right away if you have:  A racing heart and shortness of breath.  Thoughts of hurting yourself or others. If you ever feel like you may hurt yourself or others, or have thoughts about taking your own life, get help right away. You can go to your nearest emergency department or call:  Your local emergency services (911 in the [place]  A suicide crisis helpline, such as the [place] at 915-581-2664. This is open 24 hours a day. Summary  Taking steps to learn and use tension reduction techniques can help calm you and help prevent triggering an anxiety reaction.  When used together, medicines, psychotherapy, and tension reduction techniques may be the most effective treatment.  Family, friends, and partners can play a big part in helping you recover from an anxiety disorder. This information is not intended to replace advice given to you by your health care provider. Make sure you discuss any questions you have with your health care provider. Document  Revised: 10/14/2018 Document Reviewed: 10/14/2018 Elsevier Patient Education  [place] Following a healthy eating pattern may help you to achieve and maintain a healthy body weight, reduce the risk of chronic disease, and live a long and productive life. It is important to follow a healthy eating pattern at an appropriate calorie level for your body. Your nutritional needs should be met primarily through food  by choosing a variety of nutrient-rich foods. What are tips for following this plan? Reading food labels  Read labels and choose the following: ? Reduced or low sodium. ? Juices with 100% fruit juice. ? Foods with low saturated fats and high polyunsaturated and monounsaturated fats. ? Foods with whole grains, such as whole wheat, cracked wheat, brown rice, and wild rice. ? Whole grains that are fortified with folic acid. This is recommended for women who are pregnant or who want to become pregnant.  Read labels and avoid the following: ? Foods with a lot of added sugars. These include foods that contain brown sugar, corn sweetener, corn syrup, dextrose, fructose, glucose, high-fructose corn syrup, honey, invert sugar, lactose, malt syrup, maltose, molasses, raw sugar, sucrose, trehalose, or turbinado sugar.  Do not eat more than the following amounts of added sugar per day:  6 teaspoons (25 g) for women.  9 teaspoons (38 g) for men. ? Foods that contain processed or refined starches and grains. ? Refined grain products, such as white flour, degermed cornmeal, white bread, and white rice. Shopping  Choose nutrient-rich snacks, such as vegetables, whole fruits, and nuts. Avoid high-calorie and high-sugar snacks, such as potato chips, fruit snacks, and candy.  Use oil-based dressings and spreads on foods instead of solid fats such as butter, stick margarine, or cream cheese.  Limit pre-made sauces, mixes, and "instant" products such as flavored rice, instant noodles, and ready-made pasta.  Try more plant-protein sources, such as tofu, tempeh, black beans, edamame, lentils, nuts, and seeds.  Explore eating plans such as the Mediterranean diet or vegetarian diet. Cooking  Use oil to saut or stir-fry foods instead of solid fats such as butter, stick margarine, or lard.  Try baking, boiling, grilling, or broiling instead of frying.  Remove the fatty part of meats before  cooking.  Steam vegetables in water or broth. Meal planning  At meals, imagine dividing your plate into fourths: ? One-half of your plate is fruits and vegetables. ? One-fourth of your plate is whole grains. ? One-fourth of your plate is protein, especially lean meats, poultry, eggs, tofu, beans, or nuts.  Include low-fat dairy as part of your daily diet.   Lifestyle  Choose healthy options in all settings, including home, work, school, restaurants, or stores.  Prepare your food safely: ? Wash your hands after handling raw meats. ? Keep food preparation surfaces clean by regularly washing with hot, soapy water. ? Keep raw meats separate from ready-to-eat foods, such as fruits and vegetables. ? Cook seafood, meat, poultry, and eggs to the recommended internal temperature. ? Store foods at safe temperatures. In general:  Keep cold foods at 38F (4.4C) or below.  Keep hot foods at 138F (60C) or above.  Keep your freezer at [facility] (-17.8C) or below.  Foods are no longer safe to eat when they have been between the temperatures of 40-138F (4.4-60C) for more than 2 hours. What foods should I eat? Fruits Aim to eat 2 cup-equivalents of fresh, canned (in natural juice), or frozen fruits each day. Examples of 1 cup-equivalent of  fruit include 1 small apple, 8 large strawberries, 1 cup canned fruit,  cup dried fruit, or 1 cup 100% juice. Vegetables Aim to eat 2-3 cup-equivalents of fresh and frozen vegetables each day, including different varieties and colors. Examples of 1 cup-equivalent of vegetables include 2 medium carrots, 2 cups raw, leafy greens, 1 cup chopped vegetable (raw or cooked), or 1 medium baked potato. Grains Aim to eat 6 ounce-equivalents of whole grains each day. Examples of 1 ounce-equivalent of grains include 1 slice of bread, 1 cup ready-to-eat cereal, 3 cups popcorn, or  cup cooked rice, pasta, or cereal. Meats and other proteins Aim to eat 5-6  ounce-equivalents of protein each day. Examples of 1 ounce-equivalent of protein include 1 egg, 1/2 cup nuts or seeds, or 1 tablespoon (16 g) peanut butter. A cut of meat or fish that is the size of a deck of cards is about 3-4 ounce-equivalents.  Of the protein you eat each week, try to have at least 8 ounces come from seafood. This includes salmon, trout, herring, and anchovies. Dairy Aim to eat 3 cup-equivalents of fat-free or low-fat dairy each day. Examples of 1 cup-equivalent of dairy include 1 cup (240 mL) milk, 8 ounces (250 g) yogurt, 1 ounces (44 g) natural cheese, or 1 cup (240 mL) fortified soy milk. Fats and oils  Aim for about 5 teaspoons (21 g) per day. Choose monounsaturated fats, such as canola and olive oils, avocados, peanut butter, and most nuts, or polyunsaturated fats, such as sunflower, corn, and soybean oils, walnuts, pine nuts, sesame seeds, sunflower seeds, and flaxseed. Beverages  Aim for six 8-oz glasses of water per day. Limit coffee to three to five 8-oz cups per day.  Limit caffeinated beverages that have added calories, such as soda and energy drinks.  Limit alcohol intake to no more than 1 drink a day for nonpregnant women and 2 drinks a day for men. One drink equals 12 oz of beer (355 mL), 5 oz of wine (148 mL), or 1 oz of hard liquor (44 mL). Seasoning and other foods  Avoid adding excess amounts of salt to your foods. Try flavoring foods with herbs and spices instead of salt.  Avoid adding sugar to foods.  Try using oil-based dressings, sauces, and spreads instead of solid fats. This information is based on general [place] nutrition guidelines. For more information, visit Build[employer].es. Exact amounts may vary based on your nutrition needs. Summary  A healthy eating plan may help you to maintain a healthy weight, reduce the risk of chronic diseases, and stay active throughout your life.  Plan your meals. Make sure you eat the right portions of a  variety of nutrient-rich foods.  Try baking, boiling, grilling, or broiling instead of frying.  Choose healthy options in all settings, including home, work, school, restaurants, or stores. This information is not intended to replace advice given to you by your health care provider. Make sure you discuss any questions you have with your health care provider. Document Revised: 08/26/2017 Document Reviewed: 08/26/2017 Elsevier Patient Education  [place].

## 2020-11-30 ENCOUNTER — Encounter (HOSPITAL_BASED_OUTPATIENT_CLINIC_OR_DEPARTMENT_OTHER): Payer: Self-pay | Admitting: Emergency Medicine

## 2020-11-30 ENCOUNTER — Emergency Department (HOSPITAL_BASED_OUTPATIENT_CLINIC_OR_DEPARTMENT_OTHER)
Admission: EM | Admit: 2020-11-30 | Discharge: 2020-11-30 | Disposition: A | Discharge status: Home / Self Care | Payer: BC Managed Care – PPO | Attending: Emergency Medicine | Admitting: Emergency Medicine

## 2020-11-30 ENCOUNTER — Other Ambulatory Visit: Payer: Self-pay

## 2020-11-30 DIAGNOSIS — Z853 Personal history of malignant neoplasm of breast: Secondary | ICD-10-CM | POA: Insufficient documentation

## 2020-11-30 DIAGNOSIS — H1031 Unspecified acute conjunctivitis, right eye: Secondary | ICD-10-CM | POA: Insufficient documentation

## 2020-11-30 DIAGNOSIS — B309 Viral conjunctivitis, unspecified: Secondary | ICD-10-CM | POA: Diagnosis not present

## 2020-11-30 DIAGNOSIS — H5711 Ocular pain, right eye: Secondary | ICD-10-CM | POA: Diagnosis not present

## 2020-11-30 DIAGNOSIS — Z85828 Personal history of other malignant neoplasm of skin: Secondary | ICD-10-CM | POA: Diagnosis not present

## 2020-11-30 MED ORDER — TETRACAINE HCL 0.5 % OP SOLN
2.0000 [drp] | Freq: Once | OPHTHALMIC | Status: AC
  Administered 2020-11-30: 10:00:00 2 [drp] via OPHTHALMIC
  Filled 2020-11-30: qty 4

## 2020-11-30 MED ORDER — FLUORESCEIN SODIUM 1 MG OP STRP
1.0000 | ORAL_STRIP | Freq: Once | OPHTHALMIC | Status: AC
  Administered 2020-11-30: 10:00:00 1 via OPHTHALMIC
  Filled 2020-11-30: qty 1

## 2020-11-30 NOTE — ED Triage Notes (Signed)
Pt reports right eye "burning and blurred vision" that started yesterday.

## 2020-11-30 NOTE — ED Provider Notes (Addendum)
[facility] EMERGENCY DEPARTMENT Provider Note   CSN: [id number] Arrival date & time: 11/30/20  0848     History Chief Complaint  Patient presents with   Eye Pain    Philip Marquez is a 30 y.o. male.  Presented to the emergency room with concern for right eye pain.  Patient reports pain started yesterday.  Also noted some associated redness to the eye.  Today he notes sensation of feeling over the right of the eye, feels that his vision is somewhat blurred.  No alleviating or aggravating factors. Constant.  No fevers, no drainage, no generalized headaches.  No numbness or weakness, speech change.  Has history of breast cancer and neuroblastoma, states that he is in remission for both of these cancers.  Does not wear contact lenses.  No recent trauma.  Does have prescription glasses, seen someone at [facility]  HPI     Past Medical History:  Diagnosis Date   Breast cancer ([place])    Neuroblastoma of adrenal gland ([place])    Panic attacks    Skin cancer    Status post autologous bone marrow transplant [facility])     Patient Active Problem List   Diagnosis Date Noted   Major depressive disorder, recurrent episode, moderate ([place]) 07/14/2020   Generalized anxiety disorder 03/22/2014   Breast cancer, right breast ([place]) 12/28/2013   Neuroblastoma of adrenal gland ([place]) 12/28/2013   Family history of malignant neoplasm of gastrointestinal tract 12/28/2013   Family history of malignant neoplasm of breast 12/28/2013   Breast cancer of lower-outer quadrant of right male breast ([place]) 12/24/2013    Past Surgical History:  Procedure Laterality Date   ADRENALECTOMY     MASTECTOMY         Family History  Problem Relation Age of Onset   Cancer Maternal Grandfather 61       colorectal   Cancer Other 25       mat great uncle (through [facility]) with possible neuroblastoma   Cancer Other 31       male mat first cousin once removed with breast cancer (related through MGF's sister)     Social History   Tobacco Use   Smoking status: Never   Smokeless tobacco: Never  Substance Use Topics   Alcohol use: Yes    Comment: occ   Drug use: Yes    Types: Marijuana    Home Medications Prior to Admission medications   Medication Sig Start Date End Date Taking? Authorizing Provider  BIOTIN PO Take 1 tablet by mouth daily.    [provider]  COLLAGEN PO Take 1 tablet by mouth daily.    [provider]  Dihydrotachysterol (DHT PO) Take 1 tablet by mouth daily.    [provider]  mirtazapine (REMERON) 15 MG tablet Take 1 tablet (15 mg total) by mouth at bedtime. Patient not taking: Reported on 10/13/2020 07/11/20   Thayer Headings, PMHNP  Multiple Vitamin (MULTIVITAMIN WITH MINERALS) TABS tablet Take 1 tablet by mouth daily.    [provider]  propranolol (INDERAL) 10 MG tablet Take 1-2 tabs po BID prn anxiety Patient not taking: Reported on 10/13/2020 04/29/20   Mozingo, Berdie Ogren, NP    Allergies    Bactrim [sulfamethoxazole-trimethoprim]  Review of Systems   Review of Systems  Constitutional:  Negative for chills and fever.  HENT:  Negative for ear pain and sore throat.        Eye pain  Eyes:  Negative for pain  and visual disturbance.  Respiratory:  Negative for cough and shortness of breath.   Cardiovascular:  Negative for chest pain and palpitations.  Gastrointestinal:  Negative for abdominal pain and vomiting.  Genitourinary:  Negative for dysuria and hematuria.  Musculoskeletal:  Negative for arthralgias and back pain.  Skin:  Negative for color change and rash.  Neurological:  Negative for seizures and syncope.  All other systems reviewed and are negative.  Physical Exam Updated Vital Signs BP (!) 131/92 (BP Location: Right Arm)   Pulse 80   Temp 98.1 F (36.7 C) (Oral)   Resp 17   Ht 5\' 7"  (1.702 m)   Wt 53.1 kg   SpO2 100%   BMI 18.32 kg/m   Physical Exam Vitals and nursing note reviewed.   Constitutional:      Appearance: He is well-developed.  HENT:     Head: Normocephalic and atraumatic.  Eyes:     Intraocular pressure: Right eye pressure is 20 mmHg. Left eye pressure is 11 mmHg. Measurements were taken using a handheld tonometer.    Comments: Left eye appears normal, normal conjunctiva, pupil is round and reactive Right eye noted to have some redness in conjunctiva, normal EOM, pupils equal round and reactive, no foreign body identified, no abnormality on eversion of leg, no abrasion noted after fluorescein stain, normal slit-lamp exam  Cardiovascular:     Rate and Rhythm: Normal rate and regular rhythm.     Heart sounds: No murmur heard. Pulmonary:     Effort: Pulmonary effort is normal. No respiratory distress.  Musculoskeletal:     Cervical back: Neck supple.  Skin:    General: Skin is warm and dry.  Neurological:     General: No focal deficit present.     Mental Status: He is alert and oriented to person, place, and time. Mental status is at baseline.     Cranial Nerves: No cranial nerve deficit.     Sensory: No sensory deficit.     Motor: No weakness.     Gait: Gait normal.  Psychiatric:        Mood and Affect: Mood normal.    ED Results / Procedures / Treatments   Labs (all labs ordered are listed, but only abnormal results are displayed) Labs Reviewed - No data to display  EKG None  Radiology No results found.  Procedures Procedures   Medications Ordered in ED Medications  tetracaine (PONTOCAINE) 0.5 % ophthalmic solution 2 drop (2 drops Right Eye Given 11/30/20 0945)  fluorescein ophthalmic strip 1 strip (1 strip Right Eye Given 11/30/20 0946)    ED Course  I have reviewed the triage vital signs and the nursing notes.  Pertinent labs & imaging results that were available during my care of the patient were reviewed by me and considered in my medical decision making (see chart for details).    MDM Rules/Calculators/A&P                           30 year old male presents to ER with concern for right eye pain.  On exam it appeared to to have some redness in his conjunctiva.  Slit-lamp exam was otherwise reassuring, normal fluorescein stain exam, no foreign body identified on lid eversion.  Tono-Pen readings within normal limits.  Visual acuity was slightly less on right (20/63) vs left (20/20).  Clinically suspect most likely viral conjunctivitis.  Given the change in visual acuity and his medical history, reviewed  case with ophthalmology.  He does not see any indication for emergent imaging at present.  Recommends outpatient follow-up if symptoms not improving over the next couple days.  Provided ophthalmology information to patient, recommended follow-up.  Reviewed return precautions and discharged home.  Final Clinical Impression(s) / ED Diagnoses Final diagnoses:  Acute viral conjunctivitis of right eye    Rx / DC Orders ED Discharge Orders     None        Lucrezia Starch, MD 11/30/20 1534    Lucrezia Starch, MD 11/30/20 1534

## 2020-11-30 NOTE — Discharge Instructions (Addendum)
Recommend continuing artificial tears.  Please follow-up with ophthalmology.  If you have worsening blurry vision, develop any numbness, weakness, speech changes, headaches, come back to ER for reassessment.

## 2020-12-01 ENCOUNTER — Other Ambulatory Visit: Payer: Self-pay

## 2020-12-01 ENCOUNTER — Emergency Department (HOSPITAL_COMMUNITY)
Admission: EM | Admit: 2020-12-01 | Discharge: 2020-12-01 | Disposition: A | Discharge status: Home / Self Care | Payer: BC Managed Care – PPO | Attending: Emergency Medicine | Admitting: Emergency Medicine

## 2020-12-01 ENCOUNTER — Encounter (HOSPITAL_COMMUNITY): Payer: Self-pay | Admitting: Emergency Medicine

## 2020-12-01 DIAGNOSIS — Z853 Personal history of malignant neoplasm of breast: Secondary | ICD-10-CM | POA: Diagnosis not present

## 2020-12-01 DIAGNOSIS — H5711 Ocular pain, right eye: Secondary | ICD-10-CM | POA: Diagnosis not present

## 2020-12-01 MED ORDER — TETRACAINE HCL 0.5 % OP SOLN
2.0000 [drp] | Freq: Once | OPHTHALMIC | Status: AC
  Administered 2020-12-01: 2 [drp] via OPHTHALMIC
  Filled 2020-12-01: qty 4

## 2020-12-01 NOTE — Discharge Instructions (Signed)
Your right eye pain is likely due to corneal abrasion which is scratching her eye.  You may use numbing eyedrop 1 to 2 drops every 4 hours as needed for irritation.  Follow-up closely with eye specialist for further care.

## 2020-12-01 NOTE — ED Provider Notes (Signed)
[facility] DEPT Provider Note   CSN: [id number] Arrival date & time: 12/01/20  1604     History Chief Complaint  Patient presents with   Eye Pain    Philip Marquez is a 30 y.o. male.  The history is provided by the patient and medical records. No language interpreter was used.  Eye Pain Pertinent negatives include no headaches.   30 year old male significant history of panic attack, neuroblastoma of the adrenal gland, depression, breast cancer presenting complaining of eye irritation.  Patient report 2 days ago while he was at his hands and his haircut, the has not is actually brushed against his right and since then he has had pain about the eye.  He described pain as a excruciating throbbing pain stabbing, primarily to the corner of his eye as if his eyes going to fall out.  Endorse light sensitivity and excessive tearing.  He does not wear contact lens.  He was seen in the ER for his complaint yesterday.  He was evaluated, was given a referral to ophthalmology however he did try to call and follow-up with them but was recommended to go to a different type of eye specialist in which she has not had a chance to be seen by them yet.  He is here with progressive worsening eye pain.  He endorsed foreign body sensation in his eye.  Past Medical History:  Diagnosis Date   Breast cancer ([place])    Neuroblastoma of adrenal gland ([place])    Panic attacks    Skin cancer    Status post autologous bone marrow transplant [facility])     Patient Active Problem List   Diagnosis Date Noted   Major depressive disorder, recurrent episode, moderate ([place]) 07/14/2020   Generalized anxiety disorder 03/22/2014   Breast cancer, right breast ([place]) 12/28/2013   Neuroblastoma of adrenal gland ([place]) 12/28/2013   Family history of malignant neoplasm of gastrointestinal tract 12/28/2013   Family history of malignant neoplasm of breast 12/28/2013   Breast cancer of lower-outer quadrant  of right male breast ([place]) 12/24/2013    Past Surgical History:  Procedure Laterality Date   ADRENALECTOMY     MASTECTOMY         Family History  Problem Relation Age of Onset   Cancer Maternal Grandfather 93       colorectal   Cancer Other 25       mat great uncle (through [facility] with possible neuroblastoma   Cancer Other 75       male mat first cousin once removed with breast cancer (related through MGF's sister)    Social History   Tobacco Use   Smoking status: Never   Smokeless tobacco: Never  Substance Use Topics   Alcohol use: Yes    Comment: occ   Drug use: Yes    Types: Marijuana    Home Medications Prior to Admission medications   Medication Sig Start Date End Date Taking? Authorizing Provider  BIOTIN PO Take 1 tablet by mouth daily.    [provider]  COLLAGEN PO Take 1 tablet by mouth daily.    [provider]  Dihydrotachysterol (DHT PO) Take 1 tablet by mouth daily.    [provider]  mirtazapine (REMERON) 15 MG tablet Take 1 tablet (15 mg total) by mouth at bedtime. Patient not taking: Reported on 10/13/2020 07/11/20   Thayer Headings, PMHNP  Multiple Vitamin (MULTIVITAMIN WITH MINERALS) TABS tablet Take 1 tablet by mouth daily.  [provider]  propranolol (INDERAL) 10 MG tablet Take 1-2 tabs po BID prn anxiety Patient not taking: Reported on 10/13/2020 04/29/20   Mozingo, Berdie Ogren, NP    Allergies    Bactrim [sulfamethoxazole-trimethoprim]  Review of Systems   Review of Systems  Constitutional:  Negative for fever.  Eyes:  Positive for photophobia, pain and redness. Negative for itching.  Neurological:  Negative for headaches.   Physical Exam Updated Vital Signs There were no vitals taken for this visit.  Physical Exam Vitals and nursing note reviewed.  Constitutional:      Appearance: He is well-developed.     Comments: Appears uncomfortable wearing sunglasses  HENT:     Head: Atraumatic.   Eyes:     General: Lids are normal. Lids are everted, no foreign bodies appreciated. Vision grossly intact.     Extraocular Movements: Extraocular movements intact.     Right eye: Normal extraocular motion.     Left eye: Normal extraocular motion.     Conjunctiva/sclera:     Right eye: Right conjunctiva is injected. No chemosis, exudate or hemorrhage.    Left eye: Left conjunctiva is not injected. No chemosis, exudate or hemorrhage. Musculoskeletal:     Cervical back: Neck supple.  Skin:    Findings: No rash.  Neurological:     Mental Status: He is alert.    ED Results / Procedures / Treatments   Labs (all labs ordered are listed, but only abnormal results are displayed) Labs Reviewed - No data to display  EKG None  Radiology No results found.  Procedures Procedures   Medications Ordered in ED Medications  tetracaine (PONTOCAINE) 0.5 % ophthalmic solution 2 drop (2 drops Right Eye Given 12/01/20 1815)    ED Course  I have reviewed the triage vital signs and the nursing notes.  Pertinent labs & imaging results that were available during my care of the patient were reviewed by me and considered in my medical decision making (see chart for details).    MDM Rules/Calculators/A&P                          BP 125/90   Pulse 74   Resp 18   SpO2 99%   Final Clinical Impression(s) / ED Diagnoses Final diagnoses:  Pain of right eye    Rx / DC Orders ED Discharge Orders     None      5:20 PM Patient complaining of R eye pain and redness after it was accidentally brushed against by his hair stylish 2 days prior.  He was seen in the ED yesterday for his complaint, from recent note, he has had his eyes stain and assessed without any signs of injury.  No Report of corneal abrasion.  Was given referral to ophthalmology but was unable to follow-up.  He is here with worsening pain.  On exam he does have some redness to his right conjunctiva and light sensitivity with  consensual eye pain.  After fluorescein stain, patient report improvement of his symptoms and able to see better.  Given the mechanisms of eye irritation when his hairstylist actually brush his knuckles against patient's eye, I suspect this is likely a corneal abrasion and was treated as such.  Will provide eyedrops, have patient follow-up with ophthalmologist for further care.  Return precaution given.  Doubt iritis or uveitis.  Doubt ocular emergency   Domenic Moras, PA-C 12/01/20 1919    Carmin Muskrat, MD  12/01/20 2354  

## 2020-12-01 NOTE — ED Triage Notes (Signed)
Patient reports burning  and blurry in the right eye. Symptoms have worsened since yesterday.

## 2020-12-01 NOTE — ED Notes (Signed)
Visual Acuity:  Right eye 20/200, left eye 20/40

## 2020-12-02 DIAGNOSIS — H2000 Unspecified acute and subacute iridocyclitis: Secondary | ICD-10-CM | POA: Diagnosis not present

## 2020-12-02 DIAGNOSIS — H02831 Dermatochalasis of right upper eyelid: Secondary | ICD-10-CM | POA: Diagnosis not present

## 2020-12-02 DIAGNOSIS — H02834 Dermatochalasis of left upper eyelid: Secondary | ICD-10-CM | POA: Diagnosis not present

## 2021-03-14 ENCOUNTER — Encounter (HOSPITAL_BASED_OUTPATIENT_CLINIC_OR_DEPARTMENT_OTHER): Payer: Self-pay | Admitting: *Deleted

## 2021-03-14 ENCOUNTER — Other Ambulatory Visit: Payer: Self-pay

## 2021-03-14 DIAGNOSIS — S61253A Open bite of left middle finger without damage to nail, initial encounter: Secondary | ICD-10-CM | POA: Insufficient documentation

## 2021-03-14 DIAGNOSIS — W5581XA Bitten by other mammals, initial encounter: Secondary | ICD-10-CM | POA: Insufficient documentation

## 2021-03-14 NOTE — ED Triage Notes (Signed)
His cat bit his left middle finger tonight while being fed. States rabies vaccine is UTD and pts DT was less than 5 years ago.

## 2021-03-15 ENCOUNTER — Emergency Department (HOSPITAL_BASED_OUTPATIENT_CLINIC_OR_DEPARTMENT_OTHER)
Admission: EM | Admit: 2021-03-15 | Discharge: 2021-03-15 | Disposition: A | Discharge status: Home / Self Care | Payer: BC Managed Care – PPO | Attending: Emergency Medicine | Admitting: Emergency Medicine

## 2021-03-15 DIAGNOSIS — W5501XA Bitten by cat, initial encounter: Secondary | ICD-10-CM

## 2021-03-15 DIAGNOSIS — S61253A Open bite of left middle finger without damage to nail, initial encounter: Secondary | ICD-10-CM | POA: Diagnosis not present

## 2021-03-15 MED ORDER — AMOXICILLIN-POT CLAVULANATE 875-125 MG PO TABS: 1.0000 | ORAL_TABLET | Freq: Two times a day (BID) | ORAL | 0 refills | Status: DC

## 2021-03-15 NOTE — ED Notes (Signed)
Discharge instructions discussed with pt. Pt verbalized understanding. Pt stable and ambulatory.  °

## 2021-03-15 NOTE — ED Provider Notes (Signed)
[facility] EMERGENCY DEPARTMENT Provider Note  CSN: [id number] Arrival date & time: 03/14/21 2226  Chief Complaint(s) Animal Bite  HPI YOEL KAUFHOLD is a 30 y.o. male here after sustaining a cat bite to the left middle finger. Patient reports this occurred 1 hour prior to arrival. He irrigated the area well.  Reports that it was bleeding but that has stopped. The animal is up-to-date with his vaccines. Denies any associated pain. No other physical complaints.   Animal Bite  Past Medical History Past Medical History:  Diagnosis Date   Breast cancer ([place])    Neuroblastoma of adrenal gland ([place])    Panic attacks    Skin cancer    Status post autologous bone marrow transplant [facility])    Patient Active Problem List   Diagnosis Date Noted   Major depressive disorder, recurrent episode, moderate ([place]) 07/14/2020   Generalized anxiety disorder 03/22/2014   Breast cancer, right breast ([place]) 12/28/2013   Neuroblastoma of adrenal gland ([place]) 12/28/2013   Family history of malignant neoplasm of gastrointestinal tract 12/28/2013   Family history of malignant neoplasm of breast 12/28/2013   Breast cancer of lower-outer quadrant of right male breast ([place]) 12/24/2013   Home Medication(s) Prior to Admission medications   Medication Sig Start Date End Date Taking? Authorizing Provider  amoxicillin-clavulanate (AUGMENTIN) 875-125 MG tablet Take 1 tablet by mouth every 12 (twelve) hours. 03/15/21  Yes Shanyn Preisler, Grayce Sessions, MD  BIOTIN PO Take 1 tablet by mouth daily.    [provider]  COLLAGEN PO Take 1 tablet by mouth daily.    [provider]  Dihydrotachysterol (DHT PO) Take 1 tablet by mouth daily.    [provider]  mirtazapine (REMERON) 15 MG tablet Take 1 tablet (15 mg total) by mouth at bedtime. Patient not taking: No sig reported 07/11/20   Thayer Headings, PMHNP  Multiple Vitamin (MULTIVITAMIN WITH MINERALS) TABS tablet Take 1 tablet by  mouth daily.    [provider]  propranolol (INDERAL) 10 MG tablet Take 1-2 tabs po BID prn anxiety Patient not taking: No sig reported 04/29/20   Mozingo, Berdie Ogren, NP                                                                                                                                    Past Surgical History Past Surgical History:  Procedure Laterality Date   ADRENALECTOMY     MASTECTOMY     Family History Family History  Problem Relation Age of Onset   Cancer Maternal Grandfather 5       colorectal   Cancer Other 25       mat great uncle (through [facility]) with possible neuroblastoma   Cancer Other 54       male mat first cousin once removed with breast cancer (related through MGF's sister)    Social History Social History   Tobacco Use  Smoking status: Never   Smokeless tobacco: Never  Vaping Use   Vaping Use: Never used  Substance Use Topics   Alcohol use: Yes    Comment: occ   Drug use: Not Currently    Types: Marijuana   Allergies Bactrim [sulfamethoxazole-trimethoprim]  Review of Systems Review of Systems All other systems are reviewed and are negative for acute change except as noted in the HPI  Physical Exam Vital Signs  I have reviewed the triage vital signs BP 131/79   Pulse 83   Temp 98.2 F (36.8 C) (Oral)   Resp 16   Ht 5\' 7"  (1.702 m)   Wt 54.4 kg   SpO2 100%   BMI 18.79 kg/m   Physical Exam Vitals reviewed.  Constitutional:      General: He is not in acute distress.    Appearance: He is well-developed. He is not diaphoretic.  HENT:     Head: Normocephalic and atraumatic.     Right Ear: External ear normal.     Left Ear: External ear normal.     Nose: Nose normal.     Mouth/Throat:     Mouth: Mucous membranes are moist.  Eyes:     General: No scleral icterus.    Conjunctiva/sclera: Conjunctivae normal.  Neck:     Trachea: Phonation normal.  Cardiovascular:     Rate and Rhythm: Normal rate and regular  rhythm.  Pulmonary:     Effort: Pulmonary effort is normal. No respiratory distress.     Breath sounds: No stridor.  Abdominal:     General: There is no distension.  Musculoskeletal:        General: Normal range of motion.       Hands:     Cervical back: Normal range of motion.  Neurological:     Mental Status: He is alert and oriented to person, place, and time.  Psychiatric:        Behavior: Behavior normal.    ED Results and Treatments Labs (all labs ordered are listed, but only abnormal results are displayed) Labs Reviewed - No data to display                                                                                                                       EKG  EKG Interpretation  Date/Time:    Ventricular Rate:    PR Interval:    QRS Duration:   QT Interval:    QTC Calculation:   R Axis:     Text Interpretation:         Radiology No results found.  Pertinent labs & imaging results that were available during my care of the patient were reviewed by me and considered in my medical decision making (see MDM for details).  Medications Ordered in ED Medications - No data to display  Procedures Procedures  (including critical care time)  Medical Decision Making / ED Course I have reviewed the nursing notes for this encounter and the patient's prior records (if available in EHR or on provided paperwork).  BRAYDYN SCHULTES was evaluated in Emergency Department on 03/15/2021 for the symptoms described in the history of present illness. He was evaluated in the context of the global COVID-19 pandemic, which necessitated consideration that the patient might be at risk for infection with the SARS-CoV-2 virus that causes COVID-19. Institutional protocols and algorithms that pertain to the evaluation of patients at risk for COVID-19 are in a state  of rapid change based on information released by regulatory bodies including the CDC and federal and state organizations. These policies and algorithms were followed during the patient's care in the ED.     Single wound from a cat bite. Areas clean. Cats up-to-date on vaccines. Patient prescribed antibiotics  Final Clinical Impression(s) / ED Diagnoses Final diagnoses:  Cat bite, initial encounter   The patient appears reasonably screened and/or stabilized for discharge and I doubt any other medical condition or other [facility] requiring further screening, evaluation, or treatment in the ED at this time prior to discharge. Safe for discharge with strict return precautions.  Disposition: Discharge  Condition: Good  I have discussed the results, Dx and Tx plan with the patient/family who expressed understanding and agree(s) with the plan. Discharge instructions discussed at length. The patient/family was given strict return precautions who verbalized understanding of the instructions. No further questions at time of discharge.    ED Discharge Orders          Ordered    amoxicillin-clavulanate (AUGMENTIN) 875-125 MG tablet  Every 12 hours        03/15/21 0057             Follow Up: Vevelyn Francois, NP [address] [place] [address] [phone]  Call  as needed     This chart was dictated using voice recognition software.  Despite best efforts to proofread,  errors can occur which can change the documentation meaning.    Fatima Blank, MD 03/15/21 [phone]

## 2021-12-29 ENCOUNTER — Ambulatory Visit (HOSPITAL_COMMUNITY)
Admission: EM | Admit: 2021-12-29 | Discharge: 2021-12-29 | Disposition: A | Discharge status: Home / Self Care | Payer: Self-pay | Attending: Family Medicine | Admitting: Family Medicine

## 2021-12-29 ENCOUNTER — Other Ambulatory Visit: Payer: Self-pay

## 2021-12-29 ENCOUNTER — Ambulatory Visit (INDEPENDENT_AMBULATORY_CARE_PROVIDER_SITE_OTHER): Payer: Self-pay

## 2021-12-29 ENCOUNTER — Encounter (HOSPITAL_COMMUNITY): Payer: Self-pay | Admitting: *Deleted

## 2021-12-29 DIAGNOSIS — R0789 Other chest pain: Secondary | ICD-10-CM | POA: Insufficient documentation

## 2021-12-29 DIAGNOSIS — I498 Other specified cardiac arrhythmias: Secondary | ICD-10-CM | POA: Insufficient documentation

## 2021-12-29 DIAGNOSIS — R079 Chest pain, unspecified: Secondary | ICD-10-CM

## 2021-12-29 LAB — COMPREHENSIVE METABOLIC PANEL
ALT: 13 U/L (ref 0–44)
AST: 21 U/L (ref 15–41)
Albumin: 4.2 g/dL (ref 3.5–5.0)
Alkaline Phosphatase: 44 U/L (ref 38–126)
Anion gap: 7 (ref 5–15)
BUN: <5 mg/dL — ABNORMAL LOW (ref 6–20)
CO2: 29 mmol/L (ref 22–32)
Calcium: 10.1 mg/dL (ref 8.9–10.3)
Chloride: 104 mmol/L (ref 98–111)
Creatinine, Ser: 0.68 mg/dL (ref 0.61–1.24)
GFR, Estimated: >60 mL/min (ref >60)
Glucose, Bld: 82 mg/dL (ref 70–99)
Potassium: 4.3 mmol/L (ref 3.5–5.1)
Sodium: 140 mmol/L (ref 135–145)
Total Bilirubin: 0.6 mg/dL (ref 0.3–1.2)
Total Protein: 7 g/dL (ref 6.5–8.1)

## 2021-12-29 NOTE — ED Triage Notes (Signed)
Pt reports Lt sided Cp with dizziness for several days.

## 2021-12-29 NOTE — ED Provider Notes (Signed)
[place]    CSN: [id number] Arrival date & time: 12/29/21  1444      History   Chief Complaint Chief Complaint  Patient presents with   Chest Pain   Dizziness    HPI Philip Marquez is a 31 y.o. male.    Chest Pain Associated symptoms: dizziness   Dizziness Associated symptoms: chest pain    Here for chest pain that has been noted for the last 2 months or so.  The pain is brief and like a mild muscle cramp.  He feels it in his left lateral chest.  It will be very erratic in its timing and does not happen with particular frequency.  He has not noticed any alleviating or eliciting factors.  No associated nausea, vomiting, diaphoresis, shortness of breath or cough.   He also mentions some dizziness, but he feels that that is been happening in the warm weather when he has not drink enough fluids.  He works in the heat at [employer]  He does have a history of neuroblastoma when he was between the age of 83 and 58 and then at the age of 79 had breast cancer.  He was told he had had some medicines that meant he should have evaluation of his heart periodically.  Past Medical History:  Diagnosis Date   Breast cancer ([place])    Neuroblastoma of adrenal gland ([place])    Panic attacks    Skin cancer    Status post autologous bone marrow transplant [facility])     Patient Active Problem List   Diagnosis Date Noted   Major depressive disorder, recurrent episode, moderate ([place]) 07/14/2020   Generalized anxiety disorder 03/22/2014   Breast cancer, right breast ([place]) 12/28/2013   Neuroblastoma of adrenal gland ([place]) 12/28/2013   Family history of malignant neoplasm of gastrointestinal tract 12/28/2013   Family history of malignant neoplasm of breast 12/28/2013   Breast cancer of lower-outer quadrant of right male breast ([place]) 12/24/2013    Past Surgical History:  Procedure Laterality Date   ADRENALECTOMY     MASTECTOMY         Home Medications    Prior to Admission  medications   Medication Sig Start Date End Date Taking? Authorizing Provider  amoxicillin-clavulanate (AUGMENTIN) 875-125 MG tablet Take 1 tablet by mouth every 12 (twelve) hours. 03/15/21   Fatima Blank, MD  BIOTIN PO Take 1 tablet by mouth daily.    [provider]  COLLAGEN PO Take 1 tablet by mouth daily.    [provider]  Dihydrotachysterol (DHT PO) Take 1 tablet by mouth daily.    [provider]  mirtazapine (REMERON) 15 MG tablet Take 1 tablet (15 mg total) by mouth at bedtime. Patient not taking: No sig reported 07/11/20   Thayer Headings, PMHNP  Multiple Vitamin (MULTIVITAMIN WITH MINERALS) TABS tablet Take 1 tablet by mouth daily.    [provider]  propranolol (INDERAL) 10 MG tablet Take 1-2 tabs po BID prn anxiety Patient not taking: No sig reported 04/29/20   Mozingo, Berdie Ogren, NP    Family History Family History  Problem Relation Age of Onset   Cancer Maternal Grandfather 53       colorectal   Cancer Other 25       mat great uncle (through [facility]) with possible neuroblastoma   Cancer Other 54       male mat first cousin once removed with breast cancer (related through MGF's sister)  Social History Social History   Tobacco Use   Smoking status: Never   Smokeless tobacco: Never  Vaping Use   Vaping Use: Never used  Substance Use Topics   Alcohol use: Yes    Comment: occ   Drug use: Not Currently    Types: Marijuana     Allergies   Bactrim [sulfamethoxazole-trimethoprim]   Review of Systems Review of Systems  Cardiovascular:  Positive for chest pain.  Neurological:  Positive for dizziness.     Physical Exam Triage Vital Signs ED Triage Vitals  Enc Vitals Group     BP 12/29/21 1559 119/79     Pulse Rate 12/29/21 1559 71     Resp 12/29/21 1559 18     Temp 12/29/21 1559 98 F (36.7 C)     Temp src --      SpO2 12/29/21 1559 98 %     Weight --      Height --      Head Circumference --       Peak Flow --      Pain Score 12/29/21 1558 0     Pain Loc --      Pain Edu? --      Excl. in [place]? --    No data found.  Updated Vital Signs BP 119/79   Pulse 71   Temp 98 F (36.7 C)   Resp 18   SpO2 98%   Visual Acuity Right Eye Distance:   Left Eye Distance:   Bilateral Distance:    Right Eye Near:   Left Eye Near:    Bilateral Near:     Physical Exam Vitals reviewed.  Constitutional:      General: He is not in acute distress.    Appearance: He is not ill-appearing, toxic-appearing or diaphoretic.  HENT:     Nose: Nose normal.     Mouth/Throat:     Mouth: Mucous membranes are moist.     Pharynx: No oropharyngeal exudate or posterior oropharyngeal erythema.  Eyes:     Conjunctiva/sclera: Conjunctivae normal.     Pupils: Pupils are equal, round, and reactive to light.  Cardiovascular:     Rate and Rhythm: Normal rate and regular rhythm.     Heart sounds: No murmur heard. Pulmonary:     Effort: Pulmonary effort is normal. No respiratory distress.     Breath sounds: Normal breath sounds. No stridor. No wheezing, rhonchi or rales.  Chest:     Chest wall: No tenderness.  Abdominal:     Palpations: Abdomen is soft.     Tenderness: There is no abdominal tenderness.  Musculoskeletal:     Cervical back: Neck supple.  Lymphadenopathy:     Cervical: No cervical adenopathy.  Skin:    Capillary Refill: Capillary refill takes less than 2 seconds.     Coloration: Skin is not jaundiced or pale.  Neurological:     General: No focal deficit present.     Mental Status: He is alert and oriented to person, place, and time.  Psychiatric:        Behavior: Behavior normal.      UC Treatments / Results  Labs (all labs ordered are listed, but only abnormal results are displayed) Labs Reviewed - No data to display  EKG   Radiology No results found.  Procedures Procedures (including critical care time)  Medications Ordered in UC Medications - No data to  display  Initial Impression / Assessment and Plan / UC Course  I have reviewed the triage vital signs and the nursing notes.  Pertinent labs & imaging results that were available during my care of the patient were reviewed by me and considered in my medical decision making (see chart for details).     EKG shows a junctional rhythm today.  This is a big change from previous EKG in May 2022 when he would be in normal sinus rhythm.  Heart rate is 59 on the EKG.  He has some upsloping ST segments, but no true ST segment elevation or depression.  Chest x-ray is clear and does not show any pneumothorax or other abnormality.  CMP is drawn, since he has a new arrhythmia.  His neuroblastoma was in the abdomen most likely.  He is given contact information for cardiology, and he will follow-up with his primary care provider also Final Clinical Impressions(s) / UC Diagnoses   Final diagnoses:  None   Discharge Instructions   None    ED Prescriptions   None    PDMP not reviewed this encounter.   Barrett Henle, MD 12/29/21 [phone]

## 2021-12-29 NOTE — Discharge Instructions (Addendum)
Your chest x-ray was clear  Your EKG showed a junctional rhythm, which means your heart rhythm is generating from a different spot in your heart from usual.  Staff will notify you if there is anything significantly abnormal on your blood work.  Please follow-up with your primary care provider and with heart doctors

## 2022-01-26 ENCOUNTER — Ambulatory Visit (HOSPITAL_COMMUNITY)
Admission: EM | Admit: 2022-01-26 | Discharge: 2022-01-26 | Disposition: A | Discharge status: Home / Self Care | Payer: BC Managed Care – PPO | Attending: Family Medicine | Admitting: Family Medicine

## 2022-01-26 ENCOUNTER — Encounter (HOSPITAL_COMMUNITY): Payer: Self-pay

## 2022-01-26 DIAGNOSIS — H5711 Ocular pain, right eye: Secondary | ICD-10-CM | POA: Diagnosis not present

## 2022-01-26 DIAGNOSIS — H209 Unspecified iridocyclitis: Secondary | ICD-10-CM

## 2022-01-26 HISTORY — DX: Other specified disorders of eye and adnexa: H57.89

## 2022-01-26 MED ORDER — PREDNISOLONE ACETATE 1 % OP SUSP: 1.0000 [drp] | Freq: Four times a day (QID) | OPHTHALMIC | 0 refills | Status: DC

## 2022-01-26 NOTE — ED Provider Notes (Signed)
[place]    CSN: [id number] Arrival date & time: 01/26/22  1029      History   Chief Complaint Chief Complaint  Patient presents with   Eye Pain    HPI Philip Marquez is a 31 y.o. male.    Last year around July he had swollen, red eyes.  He was dx with iritis and given steroids.  That resolved.   He has been getting the same pain as before.  Getting shooting pain into the right eye, and feels numb like he has been rubbing all day.  This started last week.  He tried calling the eye dr, but told to come here first for a referral.  The pain is less this time, but worried it will worsen.   Past Medical History:  Diagnosis Date   Breast cancer ([place])    Eye inflammation    Right Eye   Neuroblastoma of adrenal gland ([place])    Panic attacks    Skin cancer    Status post autologous bone marrow transplant [facility])     Patient Active Problem List   Diagnosis Date Noted   Major depressive disorder, recurrent episode, moderate ([place]) 07/14/2020   Generalized anxiety disorder 03/22/2014   Breast cancer, right breast ([place]) 12/28/2013   Neuroblastoma of adrenal gland ([place]) 12/28/2013   Family history of malignant neoplasm of gastrointestinal tract 12/28/2013   Family history of malignant neoplasm of breast 12/28/2013   Breast cancer of lower-outer quadrant of right male breast ([place]) 12/24/2013    Past Surgical History:  Procedure Laterality Date   ADRENALECTOMY     MASTECTOMY         Home Medications    Prior to Admission medications   Medication Sig Start Date End Date Taking? Authorizing Provider  BIOTIN PO Take 1 tablet by mouth daily.   Yes [provider]  COLLAGEN PO Take 1 tablet by mouth daily.   Yes [provider]  Dihydrotachysterol (DHT PO) Take 1 tablet by mouth daily.   Yes [provider]  Multiple Vitamin (MULTIVITAMIN WITH MINERALS) TABS tablet Take 1 tablet by mouth daily.   Yes [provider]    Family  History Family History  Problem Relation Age of Onset   Cancer Maternal Grandfather 31       colorectal   Cancer Other 25       mat great uncle (through [facility]) with possible neuroblastoma   Cancer Other 9       male mat first cousin once removed with breast cancer (related through MGF's sister)    Social History Social History   Tobacco Use   Smoking status: Never   Smokeless tobacco: Never  Vaping Use   Vaping Use: Never used  Substance Use Topics   Alcohol use: Yes    Comment: occ   Drug use: Not Currently    Types: Marijuana     Allergies   Bactrim [sulfamethoxazole-trimethoprim]   Review of Systems Review of Systems  Constitutional: Negative.   HENT: Negative.    Eyes:  Positive for pain.  Cardiovascular: Negative.   Gastrointestinal: Negative.   Genitourinary: Negative.   Musculoskeletal: Negative.   Psychiatric/Behavioral: Negative.       Physical Exam Triage Vital Signs ED Triage Vitals  Enc Vitals Group     BP 01/26/22 1058 115/78     Pulse Rate 01/26/22 1058 72     Resp 01/26/22 1058 16     Temp 01/26/22 1058  98.1 F (36.7 C)     Temp Source 01/26/22 1058 Oral     SpO2 01/26/22 1058 100 %     Weight 01/26/22 1100 115 lb (52.2 kg)     Height 01/26/22 1100 '5\' 6"'$  (1.676 m)     Head Circumference --      Peak Flow --      Pain Score 01/26/22 1100 3     Pain Loc --      Pain Edu? --      Excl. in [place]? --    No data found.  Updated Vital Signs BP 115/78 (BP Location: Left Arm)   Pulse 72   Temp 98.1 F (36.7 C) (Oral)   Resp 16   Ht '5\' 6"'$  (1.676 m)   Wt 52.2 kg   SpO2 100%   BMI 18.56 kg/m   Visual Acuity Right Eye Distance:   Left Eye Distance:   Bilateral Distance:    Right Eye Near:   Left Eye Near:    Bilateral Near:     Physical Exam Constitutional:      Appearance: Normal appearance.  Eyes:     General: Lids are normal.     Extraocular Movements: Extraocular movements intact.     Conjunctiva/sclera: Conjunctivae  normal.     Comments: No obvious injection to the sclera or conjunctiva;  PERRLA;  he is sensitive to light on the right;  sensitive to taking off his glasses;   Skin:    General: Skin is warm.  Neurological:     General: No focal deficit present.     Mental Status: He is alert.  Psychiatric:        Mood and Affect: Mood normal.      UC Treatments / Results  Labs (all labs ordered are listed, but only abnormal results are displayed) Labs Reviewed - No data to display  EKG   Radiology No results found.  Procedures Procedures (including critical care time)  Medications Ordered in UC Medications - No data to display  Initial Impression / Assessment and Plan / UC Course  I have reviewed the triage vital signs and the nursing notes.  Pertinent labs & imaging results that were available during my care of the patient were reviewed by me and considered in my medical decision making (see chart for details).  Patient was seen today for eye pain and sensitivity, similar to an episode of iritis last year.  I did call the on call ophthalmologist, and he suggested he started predforte qid until seen by a dr next week.  Information given to call and scheduled an appointment.    Final Clinical Impressions(s) / UC Diagnoses   Final diagnoses:  Pain of right eye  Iritis     Discharge Instructions      You were seen today for possible iritis.  I have sent out an an eye drop to use four times/day.  Please use this until you follow up with ophthalmology next week.  Please call [facility] at [phone] to  make an appointment.  They have appointments at 8:15 and 1:15 daily for emergent issues.  They are located at [place].     ED Prescriptions     Medication Sig Dispense Auth. Provider   prednisoLONE acetate (PRED FORTE) 1 % ophthalmic suspension  (Status: Discontinued) Place 1 drop into both eyes 4 (four) times daily. 5 mL Yolandra Habig, MD    prednisoLONE acetate (PRED FORTE) 1 %  ophthalmic suspension Place 1 drop into the right eye 4 (four) times daily. 5 mL Rondel Oh, MD      PDMP not reviewed this encounter.   Rondel Oh, MD 01/26/22 1151

## 2022-01-26 NOTE — ED Triage Notes (Addendum)
Patient having right eye pain for a week. States last week was diagnosed with uveitis. Patient also having pressure around the right.   Eye waters but no redness, drainage, or swelling.

## 2022-01-26 NOTE — Discharge Instructions (Addendum)
You were seen today for possible iritis.  I have sent out an an eye drop to use four times/day.  Please use this until you follow up with ophthalmology next week.  Please call [facility] at [phone] to  make an appointment.  They have appointments at 8:15 and 1:15 daily for emergent issues.  They are located at [place].

## 2022-08-13 ENCOUNTER — Ambulatory Visit (INDEPENDENT_AMBULATORY_CARE_PROVIDER_SITE_OTHER): Payer: BC Managed Care – PPO

## 2022-08-13 ENCOUNTER — Ambulatory Visit (HOSPITAL_COMMUNITY)
Admission: EM | Admit: 2022-08-13 | Discharge: 2022-08-13 | Disposition: A | Discharge status: Home / Self Care | Payer: BC Managed Care – PPO

## 2022-08-13 ENCOUNTER — Encounter (HOSPITAL_COMMUNITY): Payer: Self-pay

## 2022-08-13 DIAGNOSIS — S9032XA Contusion of left foot, initial encounter: Secondary | ICD-10-CM

## 2022-08-13 DIAGNOSIS — M79672 Pain in left foot: Secondary | ICD-10-CM

## 2022-08-13 MED ORDER — DICLOFENAC SODIUM 75 MG PO TBEC: 75.0000 mg | DELAYED_RELEASE_TABLET | Freq: Two times a day (BID) | ORAL | 0 refills | Status: AC

## 2022-08-13 NOTE — ED Provider Notes (Signed)
[place]    CSN: [id number] Arrival date & time: 08/13/22  1203      History   Chief Complaint Chief Complaint  Patient presents with   Foot Injury    HPI SYLVESTA Marquez is a 32 y.o. male.   Pleasant 32 year old male presents today due to concerns of a left foot injury.  He was at work today, and states he moved a piece of lumber wrong.  He states the wood fell and hit him just right on the dorsal aspect of his left foot.  Reported instant pain and a sharp tingling sensation, but states the tingling has since resolved.  He is uncertain how heavy the wood was.  He took Tylenol shortly after the injury.  He states when the foot is elevated or not bearing weight, he is in 1 out of 10 pain.  Upon ambulation or pressure however his pain increases to a 7 out of 10.  He is able to move his ankle and toes without discomfort.  No prior history of injury or issues with his feet.   Foot Injury   Past Medical History:  Diagnosis Date   Breast cancer ([place])    Eye inflammation    Right Eye   Neuroblastoma of adrenal gland ([place])    Panic attacks    Skin cancer    Status post autologous bone marrow transplant [facility])     Patient Active Problem List   Diagnosis Date Noted   Major depressive disorder, recurrent episode, moderate ([place]) 07/14/2020   Generalized anxiety disorder 03/22/2014   Breast cancer, right breast ([place]) 12/28/2013   Neuroblastoma of adrenal gland ([place]) 12/28/2013   Family history of malignant neoplasm of gastrointestinal tract 12/28/2013   Family history of malignant neoplasm of breast 12/28/2013   Breast cancer of lower-outer quadrant of right male breast ([place]) 12/24/2013    Past Surgical History:  Procedure Laterality Date   ADRENALECTOMY     MASTECTOMY         Home Medications    Prior to Admission medications   Medication Sig Start Date End Date Taking? Authorizing Provider  BIOTIN PO Take 1 tablet by mouth daily.   Yes [provider]  COLLAGEN PO Take 1 tablet by mouth daily.   Yes [provider]  diclofenac (VOLTAREN) 75 MG EC tablet Take 1 tablet (75 mg total) by mouth 2 (two) times daily with a meal for 7 days. 08/13/22 08/20/22 Yes Gleason Ardoin L, PA  Dihydrotachysterol (DHT PO) Take 1 tablet by mouth daily.   Yes [provider]  ibuprofen (ADVIL) 200 MG tablet Take 200 mg by mouth every 6 (six) hours as needed.   Yes [provider]  Multiple Vitamin (MULTIVITAMIN WITH MINERALS) TABS tablet Take 1 tablet by mouth daily.   Yes [provider]    Family History Family History  Problem Relation Age of Onset   Cancer Maternal Grandfather 40       colorectal   Cancer Other 25       mat great uncle (through [facility]) with possible neuroblastoma   Cancer Other 66       male mat first cousin once removed with breast cancer (related through MGF's sister)    Social History Social History   Tobacco Use   Smoking status: Never   Smokeless tobacco: Never  Vaping Use   Vaping Use: Never used  Substance Use Topics   Alcohol use: Yes    Comment:  occ   Drug use: Not Currently    Types: Marijuana     Allergies   Bactrim [sulfamethoxazole-trimethoprim]   Review of Systems Review of Systems As per HPI  Physical Exam Triage Vital Signs ED Triage Vitals  Enc Vitals Group     BP 08/13/22 1336 106/66     Pulse Rate 08/13/22 1336 82     Resp 08/13/22 1336 16     Temp 08/13/22 1336 98.7 F (37.1 C)     Temp Source 08/13/22 1336 Oral     SpO2 08/13/22 1336 96 %     Weight --      Height --      Head Circumference --      Peak Flow --      Pain Score 08/13/22 1333 7     Pain Loc --      Pain Edu? --      Excl. in [place]? --    No data found.  Updated Vital Signs BP 106/66 (BP Location: Left Arm)   Pulse 82   Temp 98.7 F (37.1 C) (Oral)   Resp 16   SpO2 96%   Visual Acuity Right Eye Distance:   Left Eye Distance:   Bilateral Distance:    Right  Eye Near:   Left Eye Near:    Bilateral Near:     Physical Exam Vitals and nursing note reviewed.  Constitutional:      General: He is not in acute distress.    Appearance: Normal appearance. He is normal weight. He is not ill-appearing, toxic-appearing or diaphoretic.  HENT:     Head: Normocephalic and atraumatic.  Cardiovascular:     Rate and Rhythm: Normal rate.  Pulmonary:     Effort: Pulmonary effort is normal. No respiratory distress.  Musculoskeletal:        General: Tenderness present. No swelling, deformity or signs of injury. Normal range of motion.     Left lower leg: No edema.     Left ankle: Normal. No swelling. No tenderness. Normal range of motion.     Left Achilles Tendon: Normal. No tenderness. Thompson's test negative.     Left foot: Normal range of motion and normal capillary refill. Tenderness and bony tenderness present. No swelling or laceration. Normal pulse.       Feet:  Feet:     Right foot:     Skin integrity: Skin integrity normal. No blister, skin breakdown, erythema, warmth, callus, dry skin or fissure.     Left foot:     Skin integrity: Skin integrity normal. No blister, skin breakdown, erythema, warmth, callus, dry skin or fissure.     Toenail Condition: Left toenails are normal.     Comments: Pes planus No bruising noted Skin:    General: Skin is warm and dry.     Capillary Refill: Capillary refill takes less than 2 seconds.     Findings: No bruising, erythema, lesion or rash.  Neurological:     General: No focal deficit present.     Mental Status: He is alert and oriented to person, place, and time.     Sensory: No sensory deficit.     Motor: No weakness.      UC Treatments / Results  Labs (all labs ordered are listed, but only abnormal results are displayed) Labs Reviewed - No data to display  EKG   Radiology DG Foot Complete Left  Result Date: 08/13/2022 CLINICAL DATA:  Dorsal foot trauma, pain EXAM:  LEFT FOOT - COMPLETE 3+  VIEW COMPARISON:  None Available. FINDINGS: There is no evidence of fracture or dislocation. There is no evidence of arthropathy or other focal bone abnormality. Soft tissues are unremarkable. IMPRESSION: No acute abnormality Electronically Signed   By: Jerilynn Mages.  Shick M.D.   On: 08/13/2022 14:15    Procedures Procedures (including critical care time)  Medications Ordered in UC Medications - No data to display  Initial Impression / Assessment and Plan / UC Course  I have reviewed the triage vital signs and the nursing notes.  Pertinent labs & imaging results that were available during my care of the patient were reviewed by me and considered in my medical decision making (see chart for details).     Contusion L foot - xray negative for dislocation or fracture. Suspect bony contusion; will DC home with NSAID, ice and elevation.   Final Clinical Impressions(s) / UC Diagnoses   Final diagnoses:  Contusion of left foot, initial encounter     Discharge Instructions      Your xray does not show a fracture or dislocation. You sustained a contusion to the foot. Please ice the area three times daily. Elevate the foot when at home. Take diclofenac twice daily with food.      ED Prescriptions     Medication Sig Dispense Auth. Provider   diclofenac (VOLTAREN) 75 MG EC tablet Take 1 tablet (75 mg total) by mouth 2 (two) times daily with a meal for 7 days. 14 tablet Scot Shiraishi L, [place]      PDMP not reviewed this encounter.   Chaney Malling, [place] 08/13/22 1500

## 2022-08-13 NOTE — Discharge Instructions (Addendum)
Your xray does not show a fracture or dislocation. You sustained a contusion to the foot. Please ice the area three times daily. Elevate the foot when at home. Take diclofenac twice daily with food.

## 2022-08-13 NOTE — ED Triage Notes (Addendum)
Pt was at work when a object fell on left foot causing pain and discomfort today. Pt took advil for pain at 10:30am today

## 2023-04-10 ENCOUNTER — Encounter: Payer: Self-pay | Admitting: Psychiatry

## 2023-05-03 ENCOUNTER — Other Ambulatory Visit: Payer: Self-pay

## 2023-05-03 ENCOUNTER — Emergency Department (HOSPITAL_BASED_OUTPATIENT_CLINIC_OR_DEPARTMENT_OTHER)
Admission: EM | Admit: 2023-05-03 | Discharge: 2023-05-03 | Disposition: A | Discharge status: Home / Self Care | Payer: BC Managed Care – PPO | Attending: Emergency Medicine | Admitting: Emergency Medicine

## 2023-05-03 ENCOUNTER — Encounter (HOSPITAL_BASED_OUTPATIENT_CLINIC_OR_DEPARTMENT_OTHER): Payer: Self-pay

## 2023-05-03 DIAGNOSIS — R42 Dizziness and giddiness: Secondary | ICD-10-CM

## 2023-05-03 DIAGNOSIS — Z853 Personal history of malignant neoplasm of breast: Secondary | ICD-10-CM | POA: Insufficient documentation

## 2023-05-03 DIAGNOSIS — E860 Dehydration: Secondary | ICD-10-CM | POA: Insufficient documentation

## 2023-05-03 LAB — BASIC METABOLIC PANEL
Anion gap: 11 (ref 5–15)
BUN: 11 mg/dL (ref 6–20)
CO2: 25 mmol/L (ref 22–32)
Calcium: 9.5 mg/dL (ref 8.9–10.3)
Chloride: 102 mmol/L (ref 98–111)
Creatinine, Ser: 0.94 mg/dL (ref 0.61–1.24)
GFR, Estimated: >60 mL/min (ref >60)
Glucose, Bld: 91 mg/dL (ref 70–99)
Potassium: 3.8 mmol/L (ref 3.5–5.1)
Sodium: 138 mmol/L (ref 135–145)

## 2023-05-03 LAB — URINALYSIS, ROUTINE W REFLEX MICROSCOPIC
Bilirubin Urine: NEGATIVE
Glucose, UA: NEGATIVE mg/dL
Hgb urine dipstick: NEGATIVE
Ketones, ur: 80 mg/dL — AB
Leukocytes,Ua: NEGATIVE
Nitrite: NEGATIVE
Protein, ur: NEGATIVE mg/dL
Specific Gravity, Urine: 1.025 (ref 1.005–1.030)
pH: 5.5 (ref 5.0–8.0)

## 2023-05-03 LAB — CBC
HCT: 35.8 % — ABNORMAL LOW (ref 39.0–52.0)
Hemoglobin: 12.6 g/dL — ABNORMAL LOW (ref 13.0–17.0)
MCH: 30.1 pg (ref 26.0–34.0)
MCHC: 35.2 g/dL (ref 30.0–36.0)
MCV: 85.6 fL (ref 80.0–100.0)
Platelets: 244 10*3/uL (ref 150–400)
RBC: 4.18 MIL/uL — ABNORMAL LOW (ref 4.22–5.81)
RDW: 12.5 % (ref 11.5–15.5)
WBC: 4.4 10*3/uL (ref 4.0–10.5)
nRBC: 0 % (ref 0.0–0.2)

## 2023-05-03 LAB — CBG MONITORING, ED: Glucose-Capillary: 83 mg/dL (ref 70–99)

## 2023-05-03 MED ORDER — SODIUM CHLORIDE 0.9 % IV BOLUS
1000.0000 mL | Freq: Once | INTRAVENOUS | Status: AC
  Administered 2023-05-03: 1000 mL via INTRAVENOUS

## 2023-05-03 NOTE — ED Notes (Signed)
CBG 83 done by Gabi NT

## 2023-05-03 NOTE — ED Notes (Signed)
Placed flat on stretcher for 10 min for orthostatic VS

## 2023-05-03 NOTE — ED Triage Notes (Signed)
The patient stated he has not been taking care of himself lately. He is concerned because he has been having dizzy spells and today had a near syncopal episode after standing. No cough, fever, N/V/D.

## 2023-05-03 NOTE — ED Provider Notes (Signed)
[place] [facility] Provider Note   CSN: [id number] Arrival date & time: 05/03/23  1126     History  Chief Complaint  Patient presents with   Near Syncope    Philip Marquez is a 32 y.o. male past medical history significant for anxiety, breast cancer, and neuroblastoma of the adrenal gland presents today for near syncopal event.  Patient states he has been having intermittent dizzy spells and had a near syncopal event today while at work.  Patient denies head trauma, cough, fever, nausea, vomiting, diarrhea, chest pain, or shortness of breath.  Patient also denies any change in oral intake.   Near Syncope       Home Medications Prior to Admission medications   Medication Sig Start Date End Date Taking? Authorizing Provider  BIOTIN PO Take 1 tablet by mouth daily.    [provider]  COLLAGEN PO Take 1 tablet by mouth daily.    [provider]  Dihydrotachysterol (DHT PO) Take 1 tablet by mouth daily.    [provider]  ibuprofen (ADVIL) 200 MG tablet Take 200 mg by mouth every 6 (six) hours as needed.    [provider]  Multiple Vitamin (MULTIVITAMIN WITH MINERALS) TABS tablet Take 1 tablet by mouth daily.    [provider]      Allergies    Bactrim [sulfamethoxazole-trimethoprim]    Review of Systems   Review of Systems  Cardiovascular:  Positive for near-syncope.  Neurological:  Positive for light-headedness.    Physical Exam Updated Vital Signs BP 129/89   Pulse 63   Temp (!) 97.4 F (36.3 C) (Oral)   Resp 16   Ht 5\' 6"  (1.676 m)   Wt 54.4 kg   SpO2 100%   BMI 19.37 kg/m  Physical Exam Vitals and nursing note reviewed.  Constitutional:      General: He is not in acute distress.    Appearance: He is well-developed.  HENT:     Head: Normocephalic and atraumatic.     Right Ear: External ear normal.     Left Ear: External ear normal.     Nose: Nose normal.  Eyes:      Extraocular Movements: Extraocular movements intact.     Conjunctiva/sclera: Conjunctivae normal.     Pupils: Pupils are equal, round, and reactive to light.  Cardiovascular:     Rate and Rhythm: Normal rate and regular rhythm.     Heart sounds: No murmur heard. Pulmonary:     Effort: Pulmonary effort is normal. No respiratory distress.     Breath sounds: Normal breath sounds.  Abdominal:     Palpations: Abdomen is soft.     Tenderness: There is no abdominal tenderness.  Musculoskeletal:        General: No swelling.     Cervical back: Neck supple.  Skin:    General: Skin is warm and dry.     Capillary Refill: Capillary refill takes less than 2 seconds.  Neurological:     General: No focal deficit present.     Mental Status: He is alert.     Cranial Nerves: No cranial nerve deficit.     Motor: No weakness.  Psychiatric:        Mood and Affect: Mood normal.     ED Results / Procedures / Treatments   Labs (all labs ordered are listed, but only abnormal results are displayed) Labs Reviewed  CBC - Abnormal; Notable for the  following components:      Result Value   RBC 4.18 (*)    Hemoglobin 12.6 (*)    HCT 35.8 (*)    All other components within normal limits  URINALYSIS, ROUTINE W REFLEX MICROSCOPIC - Abnormal; Notable for the following components:   Ketones, ur 80 (*)    All other components within normal limits  BASIC METABOLIC PANEL  CBG MONITORING, ED    EKG EKG Interpretation Date/Time:  Friday May 03 2023 11:41:53 EST Ventricular Rate:  62 PR Interval:  142 QRS Duration:  79 QT Interval:  403 QTC Calculation: 410 R Axis:   64  Text Interpretation: Sinus rhythm ST elev, probable normal early repol pattern No significant change since last tracing Confirmed by Alvira Monday ([id number]) on 05/03/2023 11:54:13 AM  Radiology No results found.  Procedures Procedures    Medications Ordered in ED Medications  sodium chloride 0.9 % bolus 1,000 mL (0 mLs  Intravenous Stopped 05/03/23 1439)    ED Course/ Medical Decision Making/ A&P                                 Medical Decision Making Amount and/or Complexity of Data Reviewed Labs: ordered.   This patient presents to the ED with chief complaint(s) of dizziness with pertinent past medical history of anxiety, cancer which further complicates the presenting complaint. The complaint involves an extensive differential diagnosis and also carries with it a high risk of complications and morbidity.    The differential diagnosis includes dehydration, arrhythmia, hypoglycemia  Additional history obtained: Records reviewed Primary Care Documents  ED Course and Reassessment: Orthostatic vital signs positive Pt given 1L NS  Independent labs interpretation:  The following labs were independently interpreted:  CBC: Mild anemia at 12.6 BMP: No notable findings CBG: 89 EKG: Sinus, ST elevation probable early repol, similar to previous tracings UA: 80 ketones  Consultation: - Consulted or discussed management/test interpretation w/ external professional: Considered for admission or further workup however patient's vital signs, physical exam, and labs have all been reassuring.  Patient symptoms likely due to mild dehydration.  Patient should follow-up with PCP if symptoms persist for further evaluation and treatment.         Final Clinical Impression(s) / ED Diagnoses Final diagnoses:  Dehydration  Lightheadedness    Rx / DC Orders ED Discharge Orders     None         Dolphus Jenny, PA-C 05/03/23 1442    Alvira Monday, MD 05/03/23 2207

## 2023-05-03 NOTE — Discharge Instructions (Signed)
Today you were seen for dehydration and lightheadedness.  Thank you for letting us treat you today. After performing a physical exam and reviewing your labs, I feel you are safe to go home. Please follow up with your PCP in the next several days and provide them with your records from this visit. Return to the Emergency Room if pain becomes severe or symptoms worsen.

## 2023-12-08 ENCOUNTER — Ambulatory Visit (HOSPITAL_COMMUNITY): Payer: Self-pay | Admitting: Family Medicine

## 2023-12-08 ENCOUNTER — Other Ambulatory Visit: Payer: Self-pay

## 2023-12-08 ENCOUNTER — Encounter (HOSPITAL_COMMUNITY): Payer: Self-pay

## 2023-12-08 ENCOUNTER — Ambulatory Visit (HOSPITAL_COMMUNITY)
Admission: RE | Admit: 2023-12-08 | Discharge: 2023-12-08 | Disposition: A | Discharge status: Home / Self Care | Payer: Self-pay | Source: Ambulatory Visit | Attending: Family Medicine | Admitting: Family Medicine

## 2023-12-08 VITALS — BP 113/80 | HR 69 | Temp 98.6°F | Resp 16

## 2023-12-08 DIAGNOSIS — R55 Syncope and collapse: Secondary | ICD-10-CM | POA: Insufficient documentation

## 2023-12-08 LAB — COMPREHENSIVE METABOLIC PANEL WITH GFR
ALT: 30 U/L (ref 0–44)
AST: 26 U/L (ref 15–41)
Albumin: 4 g/dL (ref 3.5–5.0)
Alkaline Phosphatase: 60 U/L (ref 38–126)
Anion gap: 11 (ref 5–15)
BUN: 11 mg/dL (ref 6–20)
CO2: 24 mmol/L (ref 22–32)
Calcium: 9.7 mg/dL (ref 8.9–10.3)
Chloride: 101 mmol/L (ref 98–111)
Creatinine, Ser: 0.86 mg/dL (ref 0.61–1.24)
GFR, Estimated: >60 mL/min (ref >60)
Glucose, Bld: 116 mg/dL — ABNORMAL HIGH (ref 70–99)
Potassium: 3.8 mmol/L (ref 3.5–5.1)
Sodium: 136 mmol/L (ref 135–145)
Total Bilirubin: 1 mg/dL (ref 0.0–1.2)
Total Protein: 7.2 g/dL (ref 6.5–8.1)

## 2023-12-08 LAB — CBC
HCT: 36 % — ABNORMAL LOW (ref 39.0–52.0)
Hemoglobin: 12.3 g/dL — ABNORMAL LOW (ref 13.0–17.0)
MCH: 29.8 pg (ref 26.0–34.0)
MCHC: 34.2 g/dL (ref 30.0–36.0)
MCV: 87.2 fL (ref 80.0–100.0)
Platelets: 236 K/uL (ref 150–400)
RBC: 4.13 MIL/uL — ABNORMAL LOW (ref 4.22–5.81)
RDW: 13.2 % (ref 11.5–15.5)
WBC: 4.5 K/uL (ref 4.0–10.5)
nRBC: 0 % (ref 0.0–0.2)

## 2023-12-08 LAB — TSH: TSH: 0.588 u[IU]/mL (ref 0.350–4.500)

## 2023-12-08 NOTE — Discharge Instructions (Addendum)
 It was nice seeing you. I am sorry about your symptoms. This could be due to dehydration, low BP, anemia, or irregular heartbeat.  Your EKG looks great with a low-normal pulse (normal is 60 - 100). BP looks good. Your orthostatic BP was initially good, but at the last recheck, you had a BP drop. For this, please keep well hydrated and avoid prolonged standing. We will contact you if the lab results are abnormal Please see PCP soon for the need to refer to cardiology for cardiac monitoring, given a normal EKG. F/U with us  soon or return to the ED if symptoms worsen or become persistent.

## 2023-12-08 NOTE — ED Notes (Signed)
 Advised patient to follow-up with ED if he was not happy with the care he received today. Advised patient we have completed blood work, EKG and orthostatic blood pressure.Express concern with provider who took care of him today.

## 2023-12-08 NOTE — ED Provider Notes (Addendum)
 [facility]    CSN: [id number] Arrival date & time: 12/08/23  1259      History   Chief Complaint Chief Complaint  Patient presents with  . Loss of Consciousness    HPI Philip Marquez is a 33 y.o. male.   The history is provided by the patient. No language interpreter was used.  Loss of Consciousness Episode history: Passed out at work 1 week ago. He had another episode yesterday while mowing the lawn. He had something to eat and drink prior. denies head injury and he is uncertain how long he passed out. He had similar episode about 1 year ago. Timing:  Intermittent Progression:  Waxing and waning Context comment:  First episode, he was at work, then second episode at home mosing the lawn Witnessed: no   Relieved by:  Nothing Worsened by:  Nothing Ineffective treatments:  None tried Associated symptoms: dizziness and nausea   Associated symptoms: no chest pain, no recent fall and no vomiting   Associated symptoms comment:  He has SOB the first episode, but not the second. Denies head injury. Denies palpitations He came with a list of lab tests requested by his relative, who works in the health field, because of his hx of cancer. When I asked what type of cancer he had, he snapped back and said, Did you not check the record? I apologized and reviewed his hx of cancer real quick.  Past Medical History:  Diagnosis Date  . Breast cancer (HCC)   . Eye inflammation    Right Eye  . Neuroblastoma of adrenal gland (HCC)   . Panic attacks   . Skin cancer   . Status post autologous bone marrow transplant [facility])     Patient Active Problem List   Diagnosis Date Noted  . Major depressive disorder, recurrent episode, moderate (HCC) 07/14/2020  . Generalized anxiety disorder 03/22/2014  . Breast cancer, right breast (HCC) 12/28/2013  . Neuroblastoma of adrenal gland (HCC) 12/28/2013  . Family history of malignant neoplasm of gastrointestinal tract 12/28/2013  . Family  history of malignant neoplasm of breast 12/28/2013  . Breast cancer of lower-outer quadrant of right male breast (HCC) 12/24/2013    Past Surgical History:  Procedure Laterality Date  . ADRENALECTOMY    . MASTECTOMY         Home Medications    Prior to Admission medications   Medication Sig Start Date End Date Taking? Authorizing Provider  BIOTIN PO Take 1 tablet by mouth daily.    [provider]  COLLAGEN PO Take 1 tablet by mouth daily.    [provider]  Dihydrotachysterol (DHT PO) Take 1 tablet by mouth daily.    [provider]  ibuprofen  (ADVIL ) 200 MG tablet Take 200 mg by mouth every 6 (six) hours as needed.    [provider]  Multiple Vitamin (MULTIVITAMIN WITH MINERALS) TABS tablet Take 1 tablet by mouth daily.    [provider]    Family History Family History  Problem Relation Age of Onset  . Cancer Maternal Grandfather 24       colorectal  . Cancer Other 25       mat great uncle (through [facility]) with possible neuroblastoma  . Cancer Other 25       male mat first cousin once removed with breast cancer (related through MGF's sister)    Social History Social History   Tobacco Use  . Smoking status: Never  . Smokeless tobacco:  Never  Vaping Use  . Vaping status: Never Used  Substance Use Topics  . Alcohol use: Yes    Comment: occ  . Drug use: Not Currently    Types: Marijuana     Allergies   Bactrim [sulfamethoxazole-trimethoprim]   Review of Systems Review of Systems  Cardiovascular:  Positive for syncope. Negative for chest pain.  Gastrointestinal:  Positive for nausea. Negative for vomiting.  Neurological:  Positive for dizziness.     Physical Exam Triage Vital Signs ED Triage Vitals  Encounter Vitals Group     BP      Girls Systolic BP Percentile      Girls Diastolic BP Percentile      Boys Systolic BP Percentile      Boys Diastolic BP Percentile      Pulse      Resp      Temp       Temp src      SpO2      Weight      Height      Head Circumference      Peak Flow      Pain Score      Pain Loc      Pain Education      Exclude from Growth Chart    Orthostatic VS for the past 24 hrs:  BP- Lying Pulse- Lying BP- Sitting Pulse- Sitting BP- Standing at 0 minutes Pulse- Standing at 0 minutes  12/08/23 1346 109/72 72 118/80 68 (!) 130/91 93    Updated Vital Signs BP 113/80 (BP Location: Right Arm)   Pulse 69   Temp 98.6 F (37 C) (Oral)   Resp 16   SpO2 98%   Visual Acuity Right Eye Distance:   Left Eye Distance:   Bilateral Distance:    Right Eye Near:   Left Eye Near:    Bilateral Near:     Physical Exam Vitals and nursing note reviewed.  Constitutional:      General: He is not in acute distress.    Appearance: He is not toxic-appearing.  Cardiovascular:     Rate and Rhythm: Normal rate and regular rhythm.     Heart sounds: Normal heart sounds. No murmur heard. Pulmonary:     Effort: Pulmonary effort is normal. No respiratory distress.     Breath sounds: Normal breath sounds. No wheezing.  Abdominal:     General: Abdomen is flat. Bowel sounds are normal. There is no distension.     Palpations: Abdomen is soft. There is no mass.     Tenderness: There is no abdominal tenderness.  Neurological:     General: No focal deficit present.     Mental Status: He is alert.     Cranial Nerves: Cranial nerves 2-12 are intact.     Sensory: Sensation is intact.     Motor: Motor function is intact.     Gait: Gait is intact.     Deep Tendon Reflexes: Reflexes are normal and symmetric.      UC Treatments / Results  Labs (all labs ordered are listed, but only abnormal results are displayed) Labs Reviewed  CBC - Abnormal; Notable for the following components:      Result Value   RBC 4.13 (*)    Hemoglobin 12.3 (*)    HCT 36.0 (*)    All other components within normal limits  COMPREHENSIVE METABOLIC PANEL WITH GFR - Abnormal; Notable for the following  components:   Glucose, Bld  116 (*)    All other components within normal limits  TSH  HEMOGLOBIN A1C    EKG   Radiology No results found.  Procedures Procedures (including critical care time)  Medications Ordered in UC Medications - No data to display  Initial Impression / Assessment and Plan / UC Course  I have reviewed the triage vital signs and the nursing notes.  Pertinent labs & imaging results that were available during my care of the patient were reviewed by me and considered in my medical decision making (see chart for details).  Clinical Course as of 12/08/23 2035  [place] Dec 08, 2023  1326 Syncope Etiology unclear Differentials include vasovagal, dehydration, anemia, and dysrhythmia Orthostatic vital is equivocal - fall precautions discussed Check EKG, CBC, Cmet, TSH - he also requested an A1C check EKG reviewed by me - NSR @ 63bpm, no ST or T wave changes Will contact if the lab report is abnormal; he otherwise has My[employer] access per the patient Keep well hydrated As discussed with him, he will benefit from cardiac monitoring and ECHO if all tests are normal. F/U with PCP for cardiology referral ED precautions discussed He agreed with the plan [KE]  1353 Face to face encounter > 40 minutes [KE]  2032 Note: He was upset at baseline through this visit Patient was treated with respect through the visit despite this [KE]    Clinical Course User Index [KE] Anders Otto DASEN, MD      Final Clinical Impressions(s) / UC Diagnoses   Final diagnoses:  Syncope, unspecified syncope type     Discharge Instructions      It was nice seeing you. I am sorry about your symptoms. This could be due to dehydration, low BP, anemia, or irregular heartbeat.  Your EKG looks great with a low-normal pulse (normal is 60 - 100). BP looks good. Your orthostatic BP was initially good, but at the last recheck, you had a BP drop. For this, please keep well hydrated and avoid  prolonged standing. We will contact you if the lab results are abnormal Please see PCP soon for the need to refer to cardiology for cardiac monitoring, given a normal EKG. F/U with us  soon or return to the ED if symptoms worsen or become persistent.     ED Prescriptions   None    PDMP not reviewed this encounter.   Anders Otto DASEN, MD 12/08/23 1331    Anders Otto DASEN, MD 12/08/23 1359    Anders Otto DASEN, MD 12/08/23 2035

## 2023-12-08 NOTE — ED Triage Notes (Signed)
 Pt having frequent syncope while at work for the past 4-5 months. Is been seen prior for same at [facility] states nothing is been done for his condition.

## 2023-12-09 LAB — HEMOGLOBIN A1C
Hgb A1c MFr Bld: 5.3 % (ref 4.8–5.6)
Mean Plasma Glucose: 105 mg/dL

## 2024-01-06 ENCOUNTER — Ambulatory Visit: Payer: Self-pay

## 2024-01-27 ENCOUNTER — Ambulatory Visit (HOSPITAL_COMMUNITY): Payer: Self-pay | Admitting: Emergency Medicine

## 2024-01-27 ENCOUNTER — Encounter (HOSPITAL_COMMUNITY): Payer: Self-pay

## 2024-01-27 ENCOUNTER — Ambulatory Visit (HOSPITAL_COMMUNITY)
Admission: EM | Admit: 2024-01-27 | Discharge: 2024-01-27 | Disposition: A | Discharge status: Home / Self Care | Payer: Self-pay | Attending: Emergency Medicine | Admitting: Emergency Medicine

## 2024-01-27 DIAGNOSIS — R221 Localized swelling, mass and lump, neck: Secondary | ICD-10-CM | POA: Insufficient documentation

## 2024-01-27 LAB — CBC WITH DIFFERENTIAL/PLATELET
Abs Immature Granulocytes: 0.03 K/uL (ref 0.00–0.07)
Basophils Absolute: 0 K/uL (ref 0.0–0.1)
Basophils Relative: 1 %
Eosinophils Absolute: 0 K/uL (ref 0.0–0.5)
Eosinophils Relative: 1 %
HCT: 38.5 % — ABNORMAL LOW (ref 39.0–52.0)
Hemoglobin: 13.2 g/dL (ref 13.0–17.0)
Immature Granulocytes: 1 %
Lymphocytes Relative: 29 %
Lymphs Abs: 1.3 K/uL (ref 0.7–4.0)
MCH: 29.7 pg (ref 26.0–34.0)
MCHC: 34.3 g/dL (ref 30.0–36.0)
MCV: 86.5 fL (ref 80.0–100.0)
Monocytes Absolute: 0.4 K/uL (ref 0.1–1.0)
Monocytes Relative: 9 %
Neutro Abs: 2.8 K/uL (ref 1.7–7.7)
Neutrophils Relative %: 59 %
Platelets: 232 K/uL (ref 150–400)
RBC: 4.45 MIL/uL (ref 4.22–5.81)
RDW: 13.2 % (ref 11.5–15.5)
WBC: 4.6 K/uL (ref 4.0–10.5)
nRBC: 0 % (ref 0.0–0.2)

## 2024-01-27 NOTE — ED Triage Notes (Signed)
 Patient reports that he had breast cancer 10 years ago and now states he noticed a lump on his throat yesterday and was bigger today.

## 2024-01-27 NOTE — Discharge Instructions (Signed)
 Staff will call you in 1-2 hours if there is anything abnormal on your blood count.  Please monitor for change in symptoms Signs of infection (heat, redness, pain) you can return here Other symptoms such as difficultly swallowing, trouble breathing, fever or vomiting, I would recommend going to the emergency department

## 2024-01-27 NOTE — ED Provider Notes (Signed)
 [facility]    CSN: [id number] Arrival date & time: 01/27/24  1131      History   Chief Complaint Chief Complaint  Patient presents with   Mass    HPI Philip Marquez is a 33 y.o. male.  Yesterday he noticed a lump on the front of his neck He saw it in the mirror. Otherwise wasn't aware of it Not having any pain or pressure Tolerating secretions, no sore throat or dysphagia Denies fever  History of right breast cancer, post bone marrow transplant and chemotherapy. He's also had neuroblastoma of adrenal gland.  PCP visit is in 1 week  Past Medical History:  Diagnosis Date   Breast cancer (HCC)    Eye inflammation    Right Eye   Neuroblastoma of adrenal gland (HCC)    Panic attacks    Skin cancer    Status post autologous bone marrow transplant [facility])     Patient Active Problem List   Diagnosis Date Noted   Major depressive disorder, recurrent episode, moderate (HCC) 07/14/2020   Generalized anxiety disorder 03/22/2014   Breast cancer, right breast (HCC) 12/28/2013   Neuroblastoma of adrenal gland (HCC) 12/28/2013   Family history of malignant neoplasm of gastrointestinal tract 12/28/2013   Family history of malignant neoplasm of breast 12/28/2013   Breast cancer of lower-outer quadrant of right male breast (HCC) 12/24/2013    Past Surgical History:  Procedure Laterality Date   ADRENALECTOMY     MASTECTOMY         Home Medications    Prior to Admission medications   Medication Sig Start Date End Date Taking? Authorizing Provider  BIOTIN PO Take 1 tablet by mouth daily. Patient not taking: Reported on 01/27/2024    [provider]  COLLAGEN PO Take 1 tablet by mouth daily.    [provider]  Dihydrotachysterol (DHT PO) Take 1 tablet by mouth daily. Patient not taking: Reported on 01/27/2024    [provider]  ibuprofen  (ADVIL ) 200 MG tablet Take 200 mg by mouth every 6 (six) hours as needed.    [provider]   Multiple Vitamin (MULTIVITAMIN WITH MINERALS) TABS tablet Take 1 tablet by mouth daily.    [provider]    Family History Family History  Problem Relation Age of Onset   Cancer Maternal Grandfather 57       colorectal   Cancer Other 25       mat great uncle (through [facility]) with possible neuroblastoma   Cancer Other 42       male mat first cousin once removed with breast cancer (related through MGF's sister)    Social History Social History   Tobacco Use   Smoking status: Never   Smokeless tobacco: Never  Vaping Use   Vaping status: Never Used  Substance Use Topics   Alcohol use: Yes    Comment: occ   Drug use: Not Currently    Types: Marijuana     Allergies   Bactrim [sulfamethoxazole-trimethoprim]   Review of Systems Review of Systems  As per HPI  Physical Exam Triage Vital Signs ED Triage Vitals  Encounter Vitals Group     BP 01/27/24 1205 127/87     Girls Systolic BP Percentile --      Girls Diastolic BP Percentile --      Boys Systolic BP Percentile --      Boys Diastolic BP Percentile --      Pulse Rate 01/27/24 1205  67     Resp 01/27/24 1205 14     Temp 01/27/24 1205 98.2 F (36.8 C)     Temp Source 01/27/24 1205 Oral     SpO2 01/27/24 1205 100 %     Weight --      Height --      Head Circumference --      Peak Flow --      Pain Score 01/27/24 1204 0     Pain Loc --      Pain Education --      Exclude from Growth Chart --    No data found.  Updated Vital Signs BP 127/87 (BP Location: Right Arm)   Pulse 67   Temp 98.2 F (36.8 C) (Oral)   Resp 14   SpO2 100%   Physical Exam Vitals and nursing note reviewed.  Constitutional:      General: He is not in acute distress.    Appearance: Normal appearance.  HENT:     Mouth/Throat:     Pharynx: Oropharynx is clear.  Neck:     Thyroid : Thyroid  mass present.      Comments: Non tender, fairly soft, mobile mass anterior neck, just to right of midline. About 2 inches  diameter. Cardiovascular:     Rate and Rhythm: Normal rate and regular rhythm.     Pulses: Normal pulses.     Heart sounds: Normal heart sounds.  Pulmonary:     Effort: Pulmonary effort is normal.     Breath sounds: Normal breath sounds.  Abdominal:     Palpations: Abdomen is soft.  Neurological:     Mental Status: He is alert and oriented to person, place, and time.     UC Treatments / Results  Labs (all labs ordered are listed, but only abnormal results are displayed) Labs Reviewed  CBC WITH DIFFERENTIAL/PLATELET - Abnormal; Notable for the following components:      Result Value   HCT 38.5 (*)    All other components within normal limits    EKG  Radiology No results found.  Procedures Procedures (including critical care time)  Medications Ordered in UC Medications - No data to display  Initial Impression / Assessment and Plan / UC Course  I have reviewed the triage vital signs and the nursing notes.  Pertinent labs & imaging results that were available during my care of the patient were reviewed by me and considered in my medical decision making (see chart for details).  Mass on the neck that was just noticed yesterday Non tender, no current symptoms Cyst-like structure, not firm or fixed.SABRA Unclear if on the thyroid  or in neck soft tissue. Not infection at this time.  With patient history, CBC with differential obtained today -- unremarkable Sees PCP next week, they can order imaging and biopsy if indicated.  Advised reasons to return or be seen in the ED. Patient is agreeable to plan  Final Clinical Impressions(s) / UC Diagnoses   Final diagnoses:  Neck mass     Discharge Instructions      Staff will call you in 1-2 hours if there is anything abnormal on your blood count.  Please monitor for change in symptoms Signs of infection (heat, redness, pain) you can return here Other symptoms such as difficultly swallowing, trouble breathing, fever or  vomiting, I would recommend going to the emergency department    ED Prescriptions   None    PDMP not reviewed this encounter.   Caroleen Stoermer, [place],  PA-C 01/27/24 2054

## 2024-02-04 ENCOUNTER — Ambulatory Visit: Payer: Self-pay

## 2024-02-04 VITALS — BP 121/81 | HR 80 | Temp 98.2°F | Resp 16 | Ht 66.0 in | Wt 126.0 lb

## 2024-02-04 DIAGNOSIS — Z809 Family history of malignant neoplasm, unspecified: Secondary | ICD-10-CM

## 2024-02-04 DIAGNOSIS — Z1159 Encounter for screening for other viral diseases: Secondary | ICD-10-CM

## 2024-02-04 DIAGNOSIS — Z853 Personal history of malignant neoplasm of breast: Secondary | ICD-10-CM

## 2024-02-04 DIAGNOSIS — Z13220 Encounter for screening for lipoid disorders: Secondary | ICD-10-CM

## 2024-02-04 DIAGNOSIS — R221 Localized swelling, mass and lump, neck: Secondary | ICD-10-CM

## 2024-02-04 DIAGNOSIS — Z13228 Encounter for screening for other metabolic disorders: Secondary | ICD-10-CM

## 2024-02-04 DIAGNOSIS — Z1329 Encounter for screening for other suspected endocrine disorder: Secondary | ICD-10-CM

## 2024-02-04 DIAGNOSIS — Z85831 Personal history of malignant neoplasm of soft tissue: Secondary | ICD-10-CM

## 2024-02-04 DIAGNOSIS — Z7689 Persons encountering health services in other specified circumstances: Secondary | ICD-10-CM

## 2024-02-04 DIAGNOSIS — Z114 Encounter for screening for human immunodeficiency virus [HIV]: Secondary | ICD-10-CM

## 2024-02-04 DIAGNOSIS — Z130 Encounter for screening for diseases of the blood and blood-forming organs and certain disorders involving the immune mechanism: Secondary | ICD-10-CM

## 2024-02-04 NOTE — Progress Notes (Signed)
 Patient ID: Philip Marquez, male    DOB: May 16, 1991  MRN: [id number]  CC: Establish Care   Subjective: Philip Marquez is a 33 y.o. male with past medical history of neuroblastoma, breast cancer who presents to clinic to establish care.  Main concern is lump on right side of neck that he noticed one week ago. The mass is painless and moves when he swallows. Denies recent illness, difficulty breathing or fever. History of cancer, full body radiation, and chemotherapy.    Patient Active Problem List   Diagnosis Date Noted   Major depressive disorder, recurrent episode, moderate (HCC) 07/14/2020   Generalized anxiety disorder 03/22/2014   Breast cancer, right breast (HCC) 12/28/2013   Neuroblastoma of adrenal gland (HCC) 12/28/2013   Family history of malignant neoplasm of gastrointestinal tract 12/28/2013   Family history of malignant neoplasm of breast 12/28/2013   Breast cancer of lower-outer quadrant of right male breast (HCC) 12/24/2013     Current Outpatient Medications on File Prior to Visit  Medication Sig Dispense Refill   ibuprofen  (ADVIL ) 200 MG tablet Take 200 mg by mouth every 6 (six) hours as needed.     Multiple Vitamin (MULTIVITAMIN WITH MINERALS) TABS tablet Take 1 tablet by mouth daily.     No current facility-administered medications on file prior to visit.    Allergies  Allergen Reactions   Bactrim [Sulfamethoxazole-Trimethoprim] Itching    Social History   Socioeconomic History   Marital status: Single    Spouse name: Not on file   Number of children: Not on file   Years of education: Not on file   Highest education level: Associate degree: academic program  Occupational History   Not on file  Tobacco Use   Smoking status: Never   Smokeless tobacco: Never  Vaping Use   Vaping status: Never Used  Substance and Sexual Activity   Alcohol use: Yes    Comment: occ   Drug use: Not Currently    Types: Marijuana   Sexual activity: Not on file  Other  Topics Concern   Not on file  Social History Narrative   Not on file   Social Drivers of Health   Financial Resource Strain: High Risk (02/04/2024)   Overall Financial Resource Strain (CARDIA)    Difficulty of Paying Living Expenses: Very hard  Food Insecurity: Food Insecurity Present (02/04/2024)   Hunger Vital Sign    Worried About Running Out of Food in the Last Year: Sometimes true    Ran Out of Food in the Last Year: Sometimes true  Transportation Needs: No Transportation Needs (02/04/2024)   PRAPARE - Administrator, Civil Service (Medical): No    Lack of Transportation (Non-Medical): No  Physical Activity: Sufficiently Active (02/04/2024)   Exercise Vital Sign    Days of Exercise per Week: 6 days    Minutes of Exercise per Session: 30 min  Recent Concern: Physical Activity - Insufficiently Active (02/04/2024)   Exercise Vital Sign    Days of Exercise per Week: 3 days    Minutes of Exercise per Session: 40 min  Stress: No Stress Concern Present (02/04/2024)   [employer] of Occupational Health - Occupational Stress Questionnaire    Feeling of Stress: Only a little  Social Connections: Socially Isolated (02/04/2024)   Social Connection and Isolation Panel    Frequency of Communication with Friends and Family: More than three times a week    Frequency of Social Gatherings with Friends and  Family: Never    Attends Religious Services: Never    Database administrator or Organizations: No    Attends Engineer, structural: Not on file    Marital Status: Never married  Catering manager Violence: Not on file    Family History  Problem Relation Age of Onset   Cancer Maternal Grandfather 85       colorectal   Cancer Other 25       mat great uncle (through [facility]) with possible neuroblastoma   Cancer Other 84       male mat first cousin once removed with breast cancer (related through MGF's sister)    Past Surgical History:  Procedure Laterality Date    ADRENALECTOMY     MASTECTOMY      ROS: Review of Systems Negative except as stated above  PHYSICAL EXAM: BP 121/81   Pulse 80   Temp 98.2 F (36.8 C) (Oral)   Resp 16   Ht 5' 6 (1.676 m)   Wt 126 lb (57.2 kg)   SpO2 97%   BMI 20.34 kg/m   Physical Exam  General: well-appearing, no acute distress Skin: no jaundice, rashes, or lesions Throat: trachea midline, 3.5cm mass on right side of neck, round, smooth.  Cardiovascular: regular heart rate and rhythm, normal S1/S2, no murmurs, gallops, or rubs, peripheral pulses 2+ bilaterally Chest: no skeletal deformity, lungs clear to auscultation bilaterally, equal breath sounds bilaterally Musculoskeletal: normal gait Extremities: no peripheral edema, nails intact     ASSESSMENT AND PLAN: 1. Encounter to establish care with new provider (Primary) - Routine labs to screen for heme, cardiovascular and metabolic disorders.   2. Mass of right side of neck - painless 3.5 cm mass on right side of neck, round and smooth texture. Given history of malignancy and full body radiation treatments, referral to oncology placed for further evaluation. Infectious process not suspected. Does not impede respirations.  - Ambulatory referral to Hematology / Oncology - TSH, Free T4 lab draw  3. Thyroid  disorder screen - TSH Rfx on Abnormal to Free T4  4. Encounter for lipid screening for cardiovascular disease - Lipid panel  5. Screening for blood disease - CBC - Iron, TIBC and Ferritin Panel  6. Screening for metabolic disorder - Comprehensive metabolic panel with GFR  7. Encounter for screening for HIV - HIV Antibody (routine testing w rflx)  8. Encounter for hepatitis C screening test for low risk patient - Hepatitis C antibody    Patient was given the opportunity to ask questions.  Patient verbalized understanding of the plan and was able to repeat key elements of the plan. Patient was given clear instructions to go to Emergency  Department or return to medical center if symptoms don't improve, worsen, or new problems develop.The patient verbalized understanding.   Orders Placed This Encounter  Procedures   CBC   Comprehensive metabolic panel with GFR   Lipid panel   HIV Antibody (routine testing w rflx)   Hepatitis C antibody   TSH Rfx on Abnormal to Free T4   Iron, TIBC and Ferritin Panel   Ambulatory referral to Hematology / Oncology     Requested Prescriptions    No prescriptions requested or ordered in this encounter    Return in about 1 month (around 03/05/2024) for physical, follow-up.  Sula Leavy Rode, PA-C

## 2024-02-05 LAB — LIPID PANEL
Chol/HDL Ratio: 4.7 ratio (ref 0.0–5.0)
Cholesterol, Total: 249 mg/dL — ABNORMAL HIGH (ref 100–199)
HDL: 53 mg/dL (ref >39)
LDL Chol Calc (NIH): 170 mg/dL — ABNORMAL HIGH (ref 0–99)
Triglycerides: 146 mg/dL (ref 0–149)
VLDL Cholesterol Cal: 26 mg/dL (ref 5–40)

## 2024-02-05 LAB — COMPREHENSIVE METABOLIC PANEL WITH GFR
ALT: 14 IU/L (ref 0–44)
AST: 20 IU/L (ref 0–40)
Albumin: 4.7 g/dL (ref 4.1–5.1)
Alkaline Phosphatase: 70 IU/L (ref 44–121)
BUN/Creatinine Ratio: 10 (ref 9–20)
BUN: 9 mg/dL (ref 6–20)
Bilirubin Total: 0.3 mg/dL (ref 0.0–1.2)
CO2: 23 mmol/L (ref 20–29)
Calcium: 9.5 mg/dL (ref 8.7–10.2)
Chloride: 104 mmol/L (ref 96–106)
Creatinine, Ser: 0.86 mg/dL (ref 0.76–1.27)
Globulin, Total: 2.6 g/dL (ref 1.5–4.5)
Glucose: 99 mg/dL (ref 70–99)
Potassium: 4.2 mmol/L (ref 3.5–5.2)
Sodium: 145 mmol/L — ABNORMAL HIGH (ref 134–144)
Total Protein: 7.3 g/dL (ref 6.0–8.5)
eGFR: 117 mL/min/1.73 (ref >59)

## 2024-02-05 LAB — CBC
Hematocrit: 38.5 % (ref 37.5–51.0)
Hemoglobin: 12.8 g/dL — ABNORMAL LOW (ref 13.0–17.7)
MCH: 30 pg (ref 26.6–33.0)
MCHC: 33.2 g/dL (ref 31.5–35.7)
MCV: 90 fL (ref 79–97)
Platelets: 246 x10E3/uL (ref 150–450)
RBC: 4.26 x10E6/uL (ref 4.14–5.80)
RDW: 13.7 % (ref 11.6–15.4)
WBC: 5.3 x10E3/uL (ref 3.4–10.8)

## 2024-02-05 LAB — HIV ANTIBODY (ROUTINE TESTING W REFLEX): HIV Screen 4th Generation wRfx: NONREACTIVE

## 2024-02-05 LAB — HEPATITIS C ANTIBODY: Hep C Virus Ab: NONREACTIVE

## 2024-02-05 LAB — TSH RFX ON ABNORMAL TO FREE T4: TSH: 0.93 u[IU]/mL (ref 0.450–4.500)

## 2024-02-10 ENCOUNTER — Encounter: Payer: Self-pay | Admitting: Medical Oncology

## 2024-02-10 NOTE — Progress Notes (Signed)
 [facility] [phone]   Fax:([phone]  INITIAL CONSULTATION:  Patient Care Team: Leavy Rode, Sula RIGGERS as PCP - General (Physician Assistant) Gail Favorite, MD as Consulting Physician (General Surgery)  CHIEF COMPLAINTS/PURPOSE OF CONSULTATION:  Neck mass   HISTORY OF PRESENTING ILLNESS:  Philip Marquez 33 y.o. male with medical history significant for right adrenal neuroblastoma and right breast cancer.    On review of the previous records adrenal neuroblastoma was diagnosed at age 42-1/2. He had neoadjuvant chemotherapy, then right adrenalectomy, then full-body radiation therapy and chemotherapy and bone marrow transplant. He was diagnosed with invasive ductal carcinoma with papillary features and DCIS right breast at age 45 (2015) s/pright mastectomy and right axillary sentinal node biopsy. Genetic testing was negative for negative for BRCA 1/2 and p53 mutations. Per [facility] oncology note from 04/20/2015: Initially he was found to have an ipsilateral neck mass that was thought to be a cervical lymph node metastasis. FNA revealed a neurofibroma.  Patient presented to UC on 01/27/24 after noticing a lump on the right side of his neck the day prior.  CBC was checked and unremarkable at that visit.  Patient was referred to PCP for further evaluation.  Patient established care with PCP on 02/04/2024 and was referred to oncology for further workup.  TSH level was checked and normal.  Hepatitis C and HIV testing also performed on 02/04/2024 and was negative.  On exam today patient is accompanied by his mother who provides additional history.  Patient reports the neck mass was first noticed on September 1st, 2025. It appeared suddenly and has remained the same size since. It is located near a previous neck mass from 2015, which was biopsied and found to be non-cancerous. He experiences discomfort when sitting in certain positions, particularly  when the seatbelt presses against the mass. No difficulty swallowing or breathing. Denies any voice changes. He also denies weight loss, night sweats, or other enlarged lymph nodes. He denies any recent illness. Denies any recent or current dental pain.infection.  Socially, he works for [employer] and occasionally uses marijuana but does not smoke tobacco or drink alcohol. Family history includes maternal grandmother with brain cancer, maternal grandfather with colon cancer, maternal uncle with lymph node cancer, maternal cousin with breast cancer.  MEDICAL HISTORY:  Past Medical History:  Diagnosis Date   Breast cancer (HCC)    Eye inflammation    Right Eye   Neuroblastoma of adrenal gland (HCC)    Panic attacks    Skin cancer    Status post autologous bone marrow transplant (HCC)     SURGICAL HISTORY: Past Surgical History:  Procedure Laterality Date   ADRENALECTOMY     MASTECTOMY      SOCIAL HISTORY: Social History   Socioeconomic History   Marital status: Single    Spouse name: Not on file   Number of children: Not on file   Years of education: Not on file   Highest education level: Associate degree: academic program  Occupational History   Not on file  Tobacco Use   Smoking status: Never   Smokeless tobacco: Never  Vaping Use   Vaping status: Never Used  Substance and Sexual Activity   Alcohol use: Yes    Comment: occ   Drug use: Not Currently    Types: Marijuana   Sexual activity: Not on file  Other Topics Concern   Not on file  Social History Narrative   Not on file  Social Drivers of Corporate investment banker Strain: High Risk (02/04/2024)   Overall Financial Resource Strain (CARDIA)    Difficulty of Paying Living Expenses: Very hard  Food Insecurity: Food Insecurity Present (02/04/2024)   Hunger Vital Sign    Worried About Running Out of Food in the Last Year: Sometimes true    Ran Out of Food in the Last Year: Sometimes true  Transportation Needs: No  Transportation Needs (02/04/2024)   PRAPARE - Administrator, Civil Service (Medical): No    Lack of Transportation (Non-Medical): No  Physical Activity: Sufficiently Active (02/04/2024)   Exercise Vital Sign    Days of Exercise per Week: 6 days    Minutes of Exercise per Session: 30 min  Recent Concern: Physical Activity - Insufficiently Active (02/04/2024)   Exercise Vital Sign    Days of Exercise per Week: 3 days    Minutes of Exercise per Session: 40 min  Stress: No Stress Concern Present (02/04/2024)   [employer] of Occupational Health - Occupational Stress Questionnaire    Feeling of Stress: Only a little  Social Connections: Socially Isolated (02/04/2024)   Social Connection and Isolation Panel    Frequency of Communication with Friends and Family: More than three times a week    Frequency of Social Gatherings with Friends and Family: Never    Attends Religious Services: Never    Diplomatic Services operational officer: No    Attends Engineer, structural: Not on file    Marital Status: Never married  Catering manager Violence: Not on file    FAMILY HISTORY: Family History  Problem Relation Age of Onset   Cancer Maternal Grandfather 1       colorectal   Cancer Other 25       mat great uncle (through [facility]) with possible neuroblastoma   Cancer Other 87       male mat first cousin once removed with breast cancer (related through MGF's sister)    ALLERGIES:  is allergic to bactrim [sulfamethoxazole-trimethoprim].  MEDICATIONS:  Current Outpatient Medications  Medication Sig Dispense Refill   ibuprofen  (ADVIL ) 200 MG tablet Take 200 mg by mouth every 6 (six) hours as needed.     Multiple Vitamin (MULTIVITAMIN WITH MINERALS) TABS tablet Take 1 tablet by mouth daily.     No current facility-administered medications for this visit.    REVIEW OF SYSTEMS:   All other systems are reviewed and are negative for acute change except as noted in the  HPI.  PHYSICAL EXAMINATION: ECOG PERFORMANCE STATUS: 1 - Symptomatic but completely ambulatory  Vitals:   02/11/24 1226  BP: 113/82  Pulse: 85  Resp: 20  Temp: (!) 97 F (36.1 C)  SpO2: 100%   Filed Weights   02/11/24 1226  Weight: 124 lb 14.4 oz (56.7 kg)    Physical Exam Vitals reviewed.  Constitutional:      Appearance: He is not ill-appearing or toxic-appearing.  HENT:     Head: Normocephalic.     Right Ear: External ear normal.     Left Ear: External ear normal.     Nose: Nose normal.  Eyes:     General: No scleral icterus.    Conjunctiva/sclera: Conjunctivae normal.  Neck:     Trachea: Trachea and phonation normal.     Comments:  3.5 cm mass on right side of neck, round and smooth texture. Non tender. Mobile. Soft. Cardiovascular:     Rate and Rhythm: Normal  rate and regular rhythm.     Pulses: Normal pulses.     Heart sounds: Normal heart sounds.  Pulmonary:     Effort: Pulmonary effort is normal.     Breath sounds: Normal breath sounds.  Abdominal:     General: There is no distension.  Musculoskeletal:        General: Normal range of motion.     Cervical back: Normal range of motion.  Lymphadenopathy:     Head:     Right side of head: No submandibular, preauricular, posterior auricular or occipital adenopathy.     Left side of head: No submandibular, preauricular, posterior auricular or occipital adenopathy.     Cervical: No cervical adenopathy.     Upper Body:     Right upper body: No supraclavicular or axillary adenopathy.     Left upper body: No supraclavicular or axillary adenopathy.     Lower Body: No right inguinal adenopathy. No left inguinal adenopathy.  Skin:    General: Skin is warm and dry.  Neurological:     Mental Status: He is alert and oriented to person, place, and time.       LABORATORY DATA:  I have reviewed the data as listed    Latest Ref Rng & Units 02/11/2024    1:53 PM 02/04/2024    3:13 PM 01/27/2024    1:38 PM  CBC   WBC 4.0 - 10.5 K/uL 5.2  5.3  4.6   Hemoglobin 13.0 - 17.0 g/dL 86.6  87.1  86.7   Hematocrit 39.0 - 52.0 % 37.5  38.5  38.5   Platelets 150 - 400 K/uL 261  246  232        Latest Ref Rng & Units 02/11/2024    1:53 PM 02/04/2024    3:13 PM 12/08/2023    1:22 PM  CMP  Glucose 70 - 99 mg/dL 81  99  883   BUN 6 - 20 mg/dL 16  9  11    Creatinine 0.61 - 1.24 mg/dL 9.06  9.13  9.13   Sodium 135 - 145 mmol/L 138  145  136   Potassium 3.5 - 5.1 mmol/L 3.9  4.2  3.8   Chloride 98 - 111 mmol/L 103  104  101   CO2 22 - 32 mmol/L 29  23  24    Calcium 8.9 - 10.3 mg/dL 89.7  9.5  9.7   Total Protein 6.5 - 8.1 g/dL 8.4  7.3  7.2   Total Bilirubin 0.0 - 1.2 mg/dL 0.6  0.3  1.0   Alkaline Phos 38 - 126 U/L 72  70  60   AST 15 - 41 U/L 24  20  26    ALT 0 - 44 U/L 27  14  30       RADIOGRAPHIC STUDIES: I have personally reviewed the radiological images as listed and agreed with the findings in the report. CT Soft Tissue Neck W Contrast Result Date: 02/12/2024 CLINICAL DATA:  new neck mass, hx breast cancer and adrenal neuroblastoma EXAM: CT NECK WITH CONTRAST TECHNIQUE: Multidetector CT imaging of the neck was performed using the standard protocol following the bolus administration of intravenous contrast. RADIATION DOSE REDUCTION: This exam was performed according to the departmental dose-optimization program which includes automated exposure control, adjustment of the mA and/or kV according to patient size and/or use of iterative reconstruction technique. CONTRAST:  75mL OMNIPAQUE  IOHEXOL  300 MG/ML  SOLN COMPARISON:  None Available. FINDINGS: Pharynx and larynx: Normal. No  mass or swelling. Salivary glands: No inflammation, mass, or stone. Thyroid : Multiple low-density thyroid  nodules measuring up to 2.5 cm anteriorly on the right. Lymph nodes: Large (5.8 x 3.7 cm) low density mass in the lower right neck is concerning for an abnormal/enlarged lymph node. Of note, this lymph node abuts and narrows the  adjacent internal jugular vein laterally and also abuts the common carotid artery posteriorly. Vascular: Major arteries are patent in the neck. Limited intracranial: Negative. Visualized orbits: Negative. Mastoids and visualized paranasal sinuses: Clear. Skeleton: No evidence of acute fracture. Upper chest: Lung apices are clear. IMPRESSION: 1. Large (5.8 x 3.7 cm) complex low density mass in the lower right neck, posterolateral to the thyroid  is concerning for metastatic adenopathy. 2. Partially imaged intracranial mass at the right petrous apex is suspicious for a metastasis given the above findings but incompletely imaged/assessed. Recommend MRI of the brain with contrast for further evaluation. 3. Multiple thyroid  nodules measuring up to 2.5 cm. Recommend thyroid  ultrasound for further evaluation (ref: J Am Coll Radiol. 2015 Feb;12(2): 143-50). These results will be called to the ordering clinician or representative by the Radiologist Assistant, and communication documented in the PACS or [employer]. Electronically Signed   By: Gilmore GORMAN Molt M.D.   On: 02/12/2024 21:11    ASSESSMENT & PLAN HELEN CUFF is a 33 y.o. male presenting to the [facility] for consultation regarding right neck mass. Patient will proceed with laboratory workup today.   #Neck mass - Differentials include infectious process, inflammatory process, lymphoproliferative disorder or metastatic disease. - Extensive chart review revealed: He had a PET at [facility] 2015 showing: minimally FDG avid right level IV lymph node adjacent to the right thyroid  lobe measuring approximately 3.3 x 2.3 cm. There is prominent bilateral neck and supraclavicular brown fat FDG uptake. FNA performed at [facility] in 2015 w/ result: neurofibroma  - Discussed recommendations for diagnostic work up including CT soft tissue neck and labs. Will consider biopsy pending scan results. - Labs today to check CBC, CMP, LDH, flow cytometry, ESR and CRP  levels, Hepatitis B serologies.   #Age related screenings - UTD per age  -Patient will RTC when work up is complete.  Patient expressed understanding of the recommended workup and is agreeable to move forward.   All questions were answered. The patient knows to call the clinic with any problems, questions or concerns.  Shared visit with Dr. Federico.  Orders Placed This Encounter  Procedures   CT Soft Tissue Neck W Contrast    Standing Status:   Future    Number of Occurrences:   1    Expected Date:   02/12/2024    Expiration Date:   02/10/2025    If indicated for the ordered procedure, I authorize the administration of contrast media per Radiology protocol:   Yes    Does the patient have a contrast media/X-ray dye allergy?:   No    Preferred imaging location?:   [facility]   CBC with Differential (Cancer Center Only)   CMP (Cancer Center only)   C-reactive protein   Flow Cytometry, Peripheral Blood (Oncology)   Hepatitis B core antibody, total   Hepatitis B surface antibody,qualitative   Hepatitis B surface antigen   Lactate dehydrogenase   Sedimentation rate      I have spent a total of 60 minutes minutes of face-to-face and non-face-to-face time, preparing to see the patient, obtaining and/or reviewing separately obtained history, performing a medically appropriate examination,  counseling and educating the patient, ordering medications/tests/procedures, referring and communicating with other health care professionals, documenting clinical information in the electronic health record, independently interpreting results and communicating results to the patient, and care coordination.   Philip Borthwick Walisiewicz PA-C Department of Hematology/Oncology [facility] at [facility] Phone: [phone]  I have read the above note and personally examined the patient. I agree with the assessment and plan as noted above.  Briefly Philip Marquez is a  33 year old male with extensive past medical history including adrenal neuroblastoma and breast cancer (previously treated by Dr. Layla) who presents for evaluation of a lesion in his neck.  He reports that the lesion developed over the last several weeks.  It is nontender and is not interfering with any swallowing or breathing.  He notes that this is different than his chronic right sided neck mass and he feels that it grew out of it.  Due to concern for these findings he presented to our clinic for further evaluation.  At this time I would recommend ordering a CT scan of the neck in order to further evaluate.  On physical examination the lesion feels cystic in nature, though may represent a lymph node (typically malignant nodes are more firm).  Once imaging is complete we will determine the next steps, likely referral to ENT.  The patient voiced understanding of our findings and plan moving forward.   Norleen IVAR Kidney, MD Department of Hematology/Oncology [facility] at [facility] Phone: [phone] Pager: [phone] Email: [email or web address]

## 2024-02-11 ENCOUNTER — Inpatient Hospital Stay: Payer: Self-pay | Attending: Physician Assistant | Admitting: Physician Assistant

## 2024-02-11 ENCOUNTER — Inpatient Hospital Stay: Payer: Self-pay

## 2024-02-11 ENCOUNTER — Encounter: Payer: Self-pay | Admitting: Medical Oncology

## 2024-02-11 ENCOUNTER — Ambulatory Visit: Payer: Self-pay

## 2024-02-11 VITALS — BP 113/82 | HR 85 | Temp 97.0°F | Resp 20 | Wt 124.9 lb

## 2024-02-11 DIAGNOSIS — Z808 Family history of malignant neoplasm of other organs or systems: Secondary | ICD-10-CM | POA: Insufficient documentation

## 2024-02-11 DIAGNOSIS — Z604 Social exclusion and rejection: Secondary | ICD-10-CM | POA: Insufficient documentation

## 2024-02-11 DIAGNOSIS — Z79899 Other long term (current) drug therapy: Secondary | ICD-10-CM | POA: Insufficient documentation

## 2024-02-11 DIAGNOSIS — Z9011 Acquired absence of right breast and nipple: Secondary | ICD-10-CM | POA: Insufficient documentation

## 2024-02-11 DIAGNOSIS — Z882 Allergy status to sulfonamides status: Secondary | ICD-10-CM | POA: Insufficient documentation

## 2024-02-11 DIAGNOSIS — Z800 Family history of malignant neoplasm of digestive organs: Secondary | ICD-10-CM | POA: Insufficient documentation

## 2024-02-11 DIAGNOSIS — Z85828 Personal history of other malignant neoplasm of skin: Secondary | ICD-10-CM | POA: Insufficient documentation

## 2024-02-11 DIAGNOSIS — E042 Nontoxic multinodular goiter: Secondary | ICD-10-CM | POA: Insufficient documentation

## 2024-02-11 DIAGNOSIS — Z9481 Bone marrow transplant status: Secondary | ICD-10-CM | POA: Insufficient documentation

## 2024-02-11 DIAGNOSIS — Z853 Personal history of malignant neoplasm of breast: Secondary | ICD-10-CM | POA: Insufficient documentation

## 2024-02-11 DIAGNOSIS — Z803 Family history of malignant neoplasm of breast: Secondary | ICD-10-CM | POA: Insufficient documentation

## 2024-02-11 DIAGNOSIS — Z5986 Financial insecurity: Secondary | ICD-10-CM | POA: Insufficient documentation

## 2024-02-11 DIAGNOSIS — Z86000 Personal history of in-situ neoplasm of breast: Secondary | ICD-10-CM | POA: Insufficient documentation

## 2024-02-11 DIAGNOSIS — Z85858 Personal history of malignant neoplasm of other endocrine glands: Secondary | ICD-10-CM | POA: Insufficient documentation

## 2024-02-11 DIAGNOSIS — F1290 Cannabis use, unspecified, uncomplicated: Secondary | ICD-10-CM | POA: Insufficient documentation

## 2024-02-11 DIAGNOSIS — R221 Localized swelling, mass and lump, neck: Secondary | ICD-10-CM | POA: Insufficient documentation

## 2024-02-11 LAB — CBC WITH DIFFERENTIAL (CANCER CENTER ONLY)
Abs Immature Granulocytes: 0.02 K/uL (ref 0.00–0.07)
Basophils Absolute: 0 K/uL (ref 0.0–0.1)
Basophils Relative: 0 %
Eosinophils Absolute: 0 K/uL (ref 0.0–0.5)
Eosinophils Relative: 0 %
HCT: 37.5 % — ABNORMAL LOW (ref 39.0–52.0)
Hemoglobin: 13.3 g/dL (ref 13.0–17.0)
Immature Granulocytes: 0 %
Lymphocytes Relative: 26 %
Lymphs Abs: 1.4 K/uL (ref 0.7–4.0)
MCH: 29.8 pg (ref 26.0–34.0)
MCHC: 35.5 g/dL (ref 30.0–36.0)
MCV: 84.1 fL (ref 80.0–100.0)
Monocytes Absolute: 0.5 K/uL (ref 0.1–1.0)
Monocytes Relative: 9 %
Neutro Abs: 3.3 K/uL (ref 1.7–7.7)
Neutrophils Relative %: 65 %
Platelet Count: 261 K/uL (ref 150–400)
RBC: 4.46 MIL/uL (ref 4.22–5.81)
RDW: 13.1 % (ref 11.5–15.5)
WBC Count: 5.2 K/uL (ref 4.0–10.5)
nRBC: 0 % (ref 0.0–0.2)

## 2024-02-11 LAB — CMP (CANCER CENTER ONLY)
ALT: 27 U/L (ref 0–44)
AST: 24 U/L (ref 15–41)
Albumin: 5 g/dL (ref 3.5–5.0)
Alkaline Phosphatase: 72 U/L (ref 38–126)
Anion gap: 6 (ref 5–15)
BUN: 16 mg/dL (ref 6–20)
CO2: 29 mmol/L (ref 22–32)
Calcium: 10.2 mg/dL (ref 8.9–10.3)
Chloride: 103 mmol/L (ref 98–111)
Creatinine: 0.93 mg/dL (ref 0.61–1.24)
GFR, Estimated: >60 mL/min (ref >60)
Glucose, Bld: 81 mg/dL (ref 70–99)
Potassium: 3.9 mmol/L (ref 3.5–5.1)
Sodium: 138 mmol/L (ref 135–145)
Total Bilirubin: 0.6 mg/dL (ref 0.0–1.2)
Total Protein: 8.4 g/dL — ABNORMAL HIGH (ref 6.5–8.1)

## 2024-02-11 LAB — C-REACTIVE PROTEIN: CRP: 0.5 mg/dL (ref <1.0)

## 2024-02-11 LAB — HEPATITIS B SURFACE ANTIBODY,QUALITATIVE: Hep B S Ab: NONREACTIVE

## 2024-02-11 LAB — LACTATE DEHYDROGENASE: LDH: 148 U/L (ref 98–192)

## 2024-02-11 LAB — HEPATITIS B SURFACE ANTIGEN: Hepatitis B Surface Ag: NONREACTIVE

## 2024-02-11 LAB — SEDIMENTATION RATE: Sed Rate: 11 mm/h (ref 0–16)

## 2024-02-11 NOTE — Progress Notes (Signed)
 [facility]  Patient presented to clinic, with his mother Jasmin Trumbull, for his scheduled appointment with PA-C Mallie Combes. I introduced myself and provided them with my direct contact information. Patient and his mother, were both encouraged to call me with any questions/concerns they may have.  Colene KYM Raider, RN, BSN, [facility] Oncology Nurse Navigator, [facility] 02/11/2024 2:02 PM

## 2024-02-11 NOTE — Patient Instructions (Signed)
 Diagnostic Clinic Office Visit Discharge Information and Instructions  Thank you for choosing [facility] for your healthcare needs.  Below is a summary of today's discussion, along with our contact information and an outline of what to expect next.  Reason for Visit:  neck mass  Proposed Diagnostic Care Plan: Labs collected today CT scan ordered. Please call Central Scheduling Department to let schedule scan at [phone]. I will call you with the CT results.   What to Expect: - Generally, when lab tests are ordered the results can take up to 1 week for results to be available.  At that point, we will contact you to discuss your results with you.  Unless there is a critical result, we will typically wait for all of your lab results to be available before contacting you. - If a biopsy is part of your Care Plan, those results can take on average 7-10 days to result.  Once results are available, we will contact you to discuss your pathology results and any next steps. - If you have additional imaging ordered, such as a CT Scan, MRI, Ultrasound, Bone Scan, or PET scan, your imaging will need to be authorized then scheduled with the earliest available appointment.  You may be asked to travel to another hospital within [facility] who has a sooner availability, please consider doing so if asked. - If you use MyChart, your results will be available to you in the MyChart portal.  Your provider will be in touch with you as soon as all of your results are available to be discussed.  Your Diagnostic Clinic Provider:  Aldwin Micalizzi Walisiewicz PA-C and Dr. Federico Your Diagnostic Navigator:  Colene Raider RN, office number [phone]  If you or your caregiver have number blocking on your cell phones, please ensure the cancer center's numbers are not blocked.  If you are not a registered MyChart user, please consider enrolling in MyChart to receive your test results and visit notes.  You can also access  your discharge instructions electronically.  MyChart also gives you an electronic means to communicate with your Care Team instead of needing to call in to the cancer center.  We appreciate you trusting us  with your healthcare and look forward to partnering with you as we work to uncover what your potential diagnosis may be.  Please do not hesitate to reach out at any point with questions or concerns.

## 2024-02-11 NOTE — Progress Notes (Signed)
 I wanted to go over your test results:    - TSH which looks at your thyroid  function is within Normal range.    - Comprehensive metabolic panel which looks at your electrolytes, kidney and liver health shows all values within Normal ranges.   - CBC which looks at your blood count shows all values within Normal ranges.   - Lipid Panel which looks at your cholesterol shows elevated LDL bad cholesterol. Try to increase exercise to 150 minutes of moderate intensity exercise per week. Try to limit your intake of fatty and fried foods such as fast food.    - Hepatitis C and HIV screening tests were negative. You do NOT have either of the diseases.

## 2024-02-12 ENCOUNTER — Ambulatory Visit (HOSPITAL_COMMUNITY)
Admission: RE | Admit: 2024-02-12 | Discharge: 2024-02-12 | Disposition: A | Discharge status: Home / Self Care | Payer: Self-pay | Source: Ambulatory Visit | Attending: Physician Assistant | Admitting: Physician Assistant

## 2024-02-12 DIAGNOSIS — R221 Localized swelling, mass and lump, neck: Secondary | ICD-10-CM | POA: Insufficient documentation

## 2024-02-12 LAB — IRON,TIBC AND FERRITIN PANEL
Ferritin: 173 ng/mL (ref 30–400)
Iron Saturation: 23 % (ref 15–55)
Iron: 78 ug/dL (ref 38–169)
Total Iron Binding Capacity: 336 ug/dL (ref 250–450)
UIBC: 258 ug/dL (ref 111–343)

## 2024-02-12 LAB — HEPATITIS B CORE ANTIBODY, TOTAL: HEP B CORE AB: NEGATIVE

## 2024-02-12 LAB — SPECIMEN STATUS REPORT

## 2024-02-12 MED ORDER — IOHEXOL 300 MG/ML  SOLN
75.0000 mL | Freq: Once | INTRAMUSCULAR | Status: AC | PRN
  Administered 2024-02-12: 75 mL via INTRAVENOUS

## 2024-02-13 ENCOUNTER — Other Ambulatory Visit: Payer: Self-pay | Admitting: Physician Assistant

## 2024-02-13 ENCOUNTER — Encounter: Payer: Self-pay | Admitting: Medical Oncology

## 2024-02-13 ENCOUNTER — Encounter (HOSPITAL_COMMUNITY): Payer: Self-pay

## 2024-02-13 DIAGNOSIS — G939 Disorder of brain, unspecified: Secondary | ICD-10-CM

## 2024-02-13 DIAGNOSIS — R221 Localized swelling, mass and lump, neck: Secondary | ICD-10-CM

## 2024-02-13 DIAGNOSIS — E042 Nontoxic multinodular goiter: Secondary | ICD-10-CM

## 2024-02-13 NOTE — Progress Notes (Signed)
 I notified [name] by phone regarding lab and CT soft tissue neck results. Findings showing no immunity to hepatitis B as well as 5.8 x 3.7 cm low density mass in the lower right neck is concerning for an abnormal/enlarged lymph node, multiple thyroid  nodules,and partially imaged intracranial mass at the right petrous apex. Will order brain MRI, thyroid  US , and US  core biopsy of neck mass. Patient knows to repeat hepatitis B vaccine series with PCP once [facility] work up is complete. All of patient's questions were answered and he expressed understanding of the plan provided. Will follow up on all imaging results when available.

## 2024-02-13 NOTE — Progress Notes (Signed)
 PROCEDURE / BIOPSY REVIEW Date: 02/13/24  Requested Biopsy site: R neck Reason for request: Mass. Hx neuroblastoma and breast CA in a male Imaging review: Best seen on CT neck, 02/12/24  Decision: Approved Imaging modality to perform: Ultrasound Schedule with: No sedation / Local anesthetic Schedule for: Any VIR  Additional comments:  [facility]) Pt. Expedite request per referring provider. @Schedulers . US  R neck mass core Bx. Local only  Please contact me with questions, concerns, or if issue pertaining to this request arise.  Thom Hall, MD [facility]

## 2024-02-15 ENCOUNTER — Encounter (HOSPITAL_COMMUNITY): Payer: Self-pay

## 2024-02-15 ENCOUNTER — Ambulatory Visit (HOSPITAL_COMMUNITY)
Admission: RE | Admit: 2024-02-15 | Discharge: 2024-02-15 | Disposition: A | Discharge status: Home / Self Care | Payer: Self-pay | Source: Ambulatory Visit | Attending: Physician Assistant

## 2024-02-15 ENCOUNTER — Ambulatory Visit (HOSPITAL_COMMUNITY)
Admission: RE | Admit: 2024-02-15 | Discharge: 2024-02-15 | Disposition: A | Discharge status: Home / Self Care | Payer: Self-pay | Source: Ambulatory Visit | Attending: Physician Assistant | Admitting: Physician Assistant

## 2024-02-15 DIAGNOSIS — G939 Disorder of brain, unspecified: Secondary | ICD-10-CM | POA: Insufficient documentation

## 2024-02-15 DIAGNOSIS — E042 Nontoxic multinodular goiter: Secondary | ICD-10-CM | POA: Insufficient documentation

## 2024-02-15 MED ORDER — GADOBUTROL 1 MMOL/ML IV SOLN
6.0000 mL | Freq: Once | INTRAVENOUS | Status: AC | PRN
  Administered 2024-02-15: 6 mL via INTRAVENOUS

## 2024-02-17 ENCOUNTER — Telehealth: Payer: Self-pay | Admitting: Physician Assistant

## 2024-02-17 ENCOUNTER — Encounter: Payer: Self-pay | Admitting: Medical Oncology

## 2024-02-17 ENCOUNTER — Other Ambulatory Visit: Payer: Self-pay | Admitting: Physician Assistant

## 2024-02-17 DIAGNOSIS — E041 Nontoxic single thyroid nodule: Secondary | ICD-10-CM

## 2024-02-17 LAB — SURGICAL PATHOLOGY

## 2024-02-17 NOTE — Telephone Encounter (Signed)
 I notified Philip Marquez by phone regarding imaging results.   MR brain shows 2 masses most suggestive of meningiomas as well as right cerebellar cavernoma. Per discission with neuro oncologist Dr. Buckley we recommend monitoring for now with a repeat MR brain in 4 months and follow up clinic visit with him after the scan. The thyroid  ultrasound shows multiple nodules with nodule #1 being TI-RADS category 4 requiring FNA biopsy. Nodule #2 will need imaging surveillance with an US  in 1 year.  Thyroid  biopsy ordered and will be performed during upcoming procedure visit for the core biopsy of his cervical lymph node. Verified this with IR team. Patient is agreeable to continue with [facility] work up and is aware recommendations may change based future work up results.All of patient's questions were answered and he expressed understanding of the plan provided.

## 2024-02-18 LAB — FLOW CYTOMETRY

## 2024-02-19 ENCOUNTER — Other Ambulatory Visit: Payer: Self-pay | Admitting: Radiology

## 2024-02-19 NOTE — Progress Notes (Signed)
 Patient for US  guided Core RT Neck Mass Biopsy & US  FNA thyroid  Nodule biopsy on Thurs 02/20/24, I tried calling several times and could not get thru on his phone and was unable to leave a message. I was going to ask if he wanted mod sedation after talking with Dr Luverne about his age. Melissa had talked with him about time and date and where to come when I scheduled his RT neck mass biopsy and he was ok with local anes at that time. PT was aware to be here at 1p and check in at the [facility] registration desk. Melissa called 02/13/24

## 2024-02-20 ENCOUNTER — Ambulatory Visit
Admission: RE | Admit: 2024-02-20 | Discharge: 2024-02-20 | Disposition: A | Discharge status: Home / Self Care | Payer: Self-pay | Source: Ambulatory Visit | Attending: Physician Assistant | Admitting: Physician Assistant

## 2024-02-20 DIAGNOSIS — E041 Nontoxic single thyroid nodule: Secondary | ICD-10-CM | POA: Insufficient documentation

## 2024-02-20 DIAGNOSIS — R221 Localized swelling, mass and lump, neck: Secondary | ICD-10-CM | POA: Insufficient documentation

## 2024-02-20 DIAGNOSIS — R29818 Other symptoms and signs involving the nervous system: Secondary | ICD-10-CM | POA: Insufficient documentation

## 2024-02-20 MED ORDER — LIDOCAINE HCL (PF) 1 % IJ SOLN
5.0000 mL | Freq: Once | INTRAMUSCULAR | Status: AC
  Administered 2024-02-20: 5 mL via INTRADERMAL
  Filled 2024-02-20: qty 5

## 2024-02-20 NOTE — Procedures (Signed)
 Interventional Radiology Procedure Note  Procedure: US  Guided Biopsy of right neck mass  Complications: None  Estimated Blood Loss: < 10 mL  Findings: 18 G core biopsy of right neck mass performed under US  guidance.  Two core samples obtained and sent to Pathology.  [name]. Philip Marquez, M.D Pager:  [phone]

## 2024-03-03 ENCOUNTER — Other Ambulatory Visit: Payer: Self-pay | Admitting: Radiology

## 2024-03-03 ENCOUNTER — Encounter: Payer: Self-pay | Admitting: Radiology

## 2024-03-03 ENCOUNTER — Ambulatory Visit
Admission: RE | Admit: 2024-03-03 | Discharge: 2024-03-03 | Disposition: A | Discharge status: Home / Self Care | Payer: Self-pay | Source: Ambulatory Visit | Attending: Physician Assistant | Admitting: Physician Assistant

## 2024-03-03 DIAGNOSIS — Z85858 Personal history of malignant neoplasm of other endocrine glands: Secondary | ICD-10-CM | POA: Insufficient documentation

## 2024-03-03 DIAGNOSIS — E041 Nontoxic single thyroid nodule: Secondary | ICD-10-CM | POA: Insufficient documentation

## 2024-03-03 DIAGNOSIS — E896 Postprocedural adrenocortical (-medullary) hypofunction: Secondary | ICD-10-CM | POA: Insufficient documentation

## 2024-03-03 DIAGNOSIS — Z604 Social exclusion and rejection: Secondary | ICD-10-CM | POA: Insufficient documentation

## 2024-03-03 DIAGNOSIS — Z853 Personal history of malignant neoplasm of breast: Secondary | ICD-10-CM | POA: Insufficient documentation

## 2024-03-03 DIAGNOSIS — Z59868 Other specified financial insecurity: Secondary | ICD-10-CM | POA: Insufficient documentation

## 2024-03-03 DIAGNOSIS — Z9481 Bone marrow transplant status: Secondary | ICD-10-CM | POA: Insufficient documentation

## 2024-03-03 DIAGNOSIS — Z9011 Acquired absence of right breast and nipple: Secondary | ICD-10-CM | POA: Insufficient documentation

## 2024-03-03 DIAGNOSIS — Z85828 Personal history of other malignant neoplasm of skin: Secondary | ICD-10-CM | POA: Insufficient documentation

## 2024-03-03 HISTORY — PX: IR US LIVER BIOPSY: IMG936

## 2024-03-03 MED ORDER — ONDANSETRON HCL 4 MG/2ML IJ SOLN
4.0000 mg | Freq: Once | INTRAMUSCULAR | Status: AC
  Administered 2024-03-03: 4 mg via INTRAVENOUS

## 2024-03-03 MED ORDER — MIDAZOLAM HCL 2 MG/2ML IJ SOLN
INTRAMUSCULAR | Status: AC
  Filled 2024-03-03: qty 2

## 2024-03-03 MED ORDER — LIDOCAINE 1 % OPTIME INJ - NO CHARGE
10.0000 mL | Freq: Once | INTRAMUSCULAR | Status: AC
  Administered 2024-03-03: 10 mL
  Filled 2024-03-03: qty 10

## 2024-03-03 MED ORDER — ONDANSETRON HCL 4 MG/2ML IJ SOLN
INTRAMUSCULAR | Status: AC
  Filled 2024-03-03: qty 2

## 2024-03-03 MED ORDER — FENTANYL CITRATE (PF) 100 MCG/2ML IJ SOLN
INTRAMUSCULAR | Status: AC
  Filled 2024-03-03: qty 2

## 2024-03-03 MED ORDER — SODIUM CHLORIDE 0.9 % IV SOLN: INTRAVENOUS | Status: DC

## 2024-03-03 MED ORDER — FENTANYL CITRATE (PF) 100 MCG/2ML IJ SOLN
INTRAMUSCULAR | Status: DC | PRN
  Administered 2024-03-03: 50 ug via INTRAVENOUS

## 2024-03-03 MED ORDER — MIDAZOLAM HCL 5 MG/5ML IJ SOLN
INTRAMUSCULAR | Status: DC | PRN
  Administered 2024-03-03: 1 mg via INTRAVENOUS

## 2024-03-03 NOTE — H&P (Signed)
 Chief Complaint: Patient was seen in consultation today for thyroid  nodule   Procedure: Thyroid  Biopsy  Referring Physician(s): Nieves Penner E  Supervising Physician: Jenna Hacker  Patient Status: [facility] - Out-pt  History of Present Illness: Philip Marquez is a 33 y.o. male with a history of right adrenal neuroblastoma s/p neoadjuvant chemotherapy, adrenalectomy, radiation, and bone marrow transplant and right breast cancer s/p right mastectomy. Patient also has a known right sided neck mass previously concerning for lymph node metastasis; however, per notes, previous FNA revealed a neurofibroma. In September of this year he presented to an Urgent Care after noticing a new lump located near the previous non-cancerous nodule. CT Soft tissue neck revealed a large neck mass concerning for metastatic adenopathy as well as several thyroid  nodules. He previously presented on 9/25 for biopsy of both right neck mass and thyroid  nodule. Unfortunately, he had neck spasms and developed severe anxiety during the initial biopsy and the thyroid  biopsy was rescheduled. He presents today for his thyroid  biopsy with sedation.   Patient is resting in bed with his parents at the bedside. Denies any concerns at this time, apart from being nervous for his biopsy. Denies any fevers/chills, nausea/vomiting, chest pain, shortness of breath, or decreased neck ROM. NPO since midnight. All questions and concerns answered at the bedside.   Code Status: Full Code  Past Medical History:  Diagnosis Date   Breast cancer (HCC)    Eye inflammation    Right Eye   Neuroblastoma of adrenal gland (HCC)    Panic attacks    Skin cancer    Status post autologous bone marrow transplant (HCC)     Past Surgical History:  Procedure Laterality Date   ADRENALECTOMY     MASTECTOMY      Allergies: Bactrim [sulfamethoxazole-trimethoprim]  Medications: Prior to Admission medications   Medication Sig Start Date  End Date Taking? Authorizing Provider  ibuprofen  (ADVIL ) 200 MG tablet Take 200 mg by mouth every 6 (six) hours as needed.    [provider]  Multiple Vitamin (MULTIVITAMIN WITH MINERALS) TABS tablet Take 1 tablet by mouth daily.    [provider]     Family History  Problem Relation Age of Onset   Cancer Maternal Grandfather 107       colorectal   Cancer Other 25       mat great uncle (through [facility]) with possible neuroblastoma   Cancer Other 63       male mat first cousin once removed with breast cancer (related through MGF's sister)    Social History   Socioeconomic History   Marital status: Single    Spouse name: Not on file   Number of children: Not on file   Years of education: Not on file   Highest education level: Associate degree: academic program  Occupational History   Not on file  Tobacco Use   Smoking status: Never   Smokeless tobacco: Never  Vaping Use   Vaping status: Never Used  Substance and Sexual Activity   Alcohol use: Yes    Comment: occ   Drug use: Not Currently    Types: Marijuana   Sexual activity: Not on file  Other Topics Concern   Not on file  Social History Narrative   Not on file   Social Drivers of Health   Financial Resource Strain: High Risk (02/04/2024)   Overall Financial Resource Strain (CARDIA)    Difficulty of Paying Living Expenses: Very hard  Food Insecurity: Food  Insecurity Present (02/04/2024)   Hunger Vital Sign    Worried About Running Out of Food in the Last Year: Sometimes true    Ran Out of Food in the Last Year: Sometimes true  Transportation Needs: No Transportation Needs (02/04/2024)   PRAPARE - Administrator, Civil Service (Medical): No    Lack of Transportation (Non-Medical): No  Physical Activity: Sufficiently Active (02/04/2024)   Exercise Vital Sign    Days of Exercise per Week: 6 days    Minutes of Exercise per Session: 30 min  Recent Concern: Physical Activity - Insufficiently  Active (02/04/2024)   Exercise Vital Sign    Days of Exercise per Week: 3 days    Minutes of Exercise per Session: 40 min  Stress: No Stress Concern Present (02/04/2024)   [employer] of Occupational Health - Occupational Stress Questionnaire    Feeling of Stress: Only a little  Social Connections: Socially Isolated (02/04/2024)   Social Connection and Isolation Panel    Frequency of Communication with Friends and Family: More than three times a week    Frequency of Social Gatherings with Friends and Family: Never    Attends Religious Services: Never    Database administrator or Organizations: No    Attends Engineer, structural: Not on file    Marital Status: Never married    Review of Systems Denies any N/V, chest pain, shortness of breath, fevers/chills. All other ROS negative.  Vital Signs: BP 130/88   Pulse 67   Temp 98 F (36.7 C) (Temporal)   Resp (!) 21   Ht 5' 6 (1.676 m)   Wt 125 lb (56.7 kg)   SpO2 100%   BMI 20.18 kg/m     Physical Exam Constitutional:      Appearance: Normal appearance.  HENT:     Mouth/Throat:     Mouth: Mucous membranes are moist.     Pharynx: Oropharynx is clear.  Cardiovascular:     Rate and Rhythm: Normal rate and regular rhythm.  Pulmonary:     Effort: Pulmonary effort is normal.  Abdominal:     General: Abdomen is flat.     Palpations: Abdomen is soft.     Tenderness: There is no abdominal tenderness.  Musculoskeletal:     Cervical back: Normal range of motion.  Skin:    General: Skin is warm and dry.  Neurological:     Mental Status: He is alert and oriented to person, place, and time.  Psychiatric:        Behavior: Behavior normal.     Imaging: US  CORE BIOPSY (LYMPH NODES) Result Date: 02/20/2024 INDICATION: Right neck mass and separate right thyroid  nodule meeting criteria for fine-needle aspiration. History of prior right neck mass biopsy in 2015 [facility] demonstrate neurofibroma. EXAM: ULTRASOUND GUIDED  CORE BIOPSY OF RIGHT NECK MASS MEDICATIONS: None. ANESTHESIA/SEDATION: None PROCEDURE: The procedure, risks, benefits, and alternatives were explained to the patient. Questions regarding the procedure were encouraged and answered. The patient understands and consents to the procedure. A time out was performed prior to initiating the procedure. The right neck was prepped with chlorhexidine in a sterile fashion, and a sterile drape was applied covering the operative field. A sterile gown and sterile gloves were used for the procedure. Local anesthesia was provided with 1% Lidocaine . Ultrasound was used to localize a dominant right neck mass as well as a right thyroid  nodule. The neck was prepped with chlorhexidine. Local anesthesia was provided  with 1% lidocaine . Two separate 18 gauge core biopsy samples were obtained a dominant right neck mass. Material was placed on saline soaked Telfa gauze. Additional ultrasound was performed after biopsy. COMPLICATIONS: None immediate. FINDINGS: A dominant, solid hypoechoic right neck mass measures approximately 4.0 x 3.0 x 4.1 cm. Two core biopsy samples were able to be obtained revealing solid tissue. With both samples, the patient experienced right-sided facial and ear pain, diaphoresis and unusual symptoms not typically associated with lymph node biopsy. Given the symptoms, no additional samples were obtained. The patient deferred planned thyroid  fine needle aspiration and did not want to proceed with an additional procedure today. Symptoms had subsided by the time the patient left the department. IMPRESSION: 1. Ultrasound-guided core biopsy performed of a dominant right neck mass measuring up to approximately 4.1 cm by ultrasound. The patient experienced some unusual neurologic symptoms with sampling and 2 core biopsy samples were able to be obtained. 2. Fine-needle aspiration of the right thyroid  nodule was not performed today and the patient desired to reschedule/defer  that procedure for a later date. Electronically Signed   By: Marcey Moan M.D.   On: 02/20/2024 16:15   US  THYROID  Result Date: 02/17/2024 CLINICAL DATA:  Incidental on CT. EXAM: THYROID  ULTRASOUND TECHNIQUE: Ultrasound examination of the thyroid  gland and adjacent soft tissues was performed. COMPARISON:  None Available. FINDINGS: Parenchymal Echotexture: Mildly heterogenous Isthmus: 0.3 cm Right lobe: 5.2 x 1.8 x 2.1 cm Left lobe: 3.8 x 1.3 x 1.0 cm _________________________________________________________ Estimated total number of nodules >/= 1 cm: 3 Number of spongiform nodules >/=  2 cm not described below (TR1): 0 Number of mixed cystic and solid nodules >/= 1.5 cm not described below (TR2): 0 _________________________________________________________ Nodule # 1: Hypoechoic solid nodule dominates the right mid gland and measures 3.4 x 2.2 x 2.9 cm. Findings are consistent with TI-RADS category 4. **Given size (>/= 1.5 cm) and appearance, fine needle aspiration of this moderately suspicious nodule should be considered based on TI-RADS criteria. Nodule # 2: Hypoechoic solid nodule in the right upper gland measures 1.3 x 0.5 x 0.8 cm. TI-RADS category 4. *Given size (>/= 1 - 1.4 cm) and appearance, a follow-up ultrasound in 1 year should be considered based on TI-RADS criteria. Nodule # 3: Predominantly cystic nodule in the thyroid  isthmus measures up to 1.5 cm. TI-RADS category 2. This nodule does NOT meet TI-RADS criteria for biopsy or dedicated follow-up. Nodule # 4: Small spongiform nodule in the left mid gland measures up to 0.8 cm. No further follow-up. IMPRESSION: 1. Nodule # 1 is a 3.4 cm TI-RADS category 4 nodule in the right mid gland meets criteria to consider fine-needle aspiration biopsy. Biopsy is recommended. 2. Nodule #2 is a smaller 1.3 cm TI-RADS category 4 nodule in the right upper gland and meets criteria for imaging surveillance. Recommend follow-up ultrasound in 1 year. The above is in  keeping with the ACR TI-RADS recommendations - J Am Coll Radiol 2017;14:587-595. Electronically Signed   By: Wilkie Lent M.D.   On: 02/17/2024 09:39   MR Brain W Wo Contrast Result Date: 02/15/2024 CLINICAL DATA:  Brain lesion seen on neck CT. History of neuroblastoma and breast cancer. EXAM: MRI HEAD WITHOUT AND WITH CONTRAST TECHNIQUE: Multiplanar, multiecho pulse sequences of the brain and surrounding structures were obtained without and with intravenous contrast. CONTRAST:  6mL GADAVIST  GADOBUTROL  1 MMOL/ML IV SOLN COMPARISON:  None Available. FINDINGS: Brain: There is no evidence of an acute infarct, midline shift, or  extra-axial fluid collection. Cerebral volume is normal. The ventricles are normal in size. A 3 mm lesion demonstrating central T2 hyperintensity, a T2 hypointense rim, and prominent susceptibility in the right cerebellar hemisphere is consistent with a cavernoma without edema. The brain itself is otherwise unremarkable in signal. A largely homogeneously enhancing extra-axial mass along the falx superior to the body of the corpus callosum projects more to the right than to the left of midline and measures 2.4 x 1.9 x 2.4 cm. There is mild mass effect on the frontal lobes and corpus callosum without brain edema. An extra-axial mass in the right petroclival region measures approximately 2.1 x 2.0 x 2.0 cm. The mass extends from the dorsum sella inferiorly to the mid clivus and projects into the prepontine cistern with mild mass effect on the right ventral pons. There may be mild invasion of the posterior right cavernous sinus, and the mass also covers the superior aspect of Meckel's cave and abuts and displaces the right trigeminal nerve laterally. The adjacent right tentorial leaflet is mildly thickened which may reflect involvement by tumor or be reactive. Vascular: Major intracranial vascular flow voids are preserved. Skull and upper cervical spine: Unremarkable bone marrow signal.  Sinuses/Orbits: Unremarkable orbits. Paranasal sinuses and mastoid air cells are clear. Other: None. IMPRESSION: 1. 2.4 cm mass along the falx and 2.1 cm right petroclival mass most suggestive of meningiomas. Mild mass effect on the brain without edema. 2. Right cerebellar cavernoma. Electronically Signed   By: Dasie Hamburg M.D.   On: 02/15/2024 16:22   CT Soft Tissue Neck W Contrast Result Date: 02/12/2024 CLINICAL DATA:  new neck mass, hx breast cancer and adrenal neuroblastoma EXAM: CT NECK WITH CONTRAST TECHNIQUE: Multidetector CT imaging of the neck was performed using the standard protocol following the bolus administration of intravenous contrast. RADIATION DOSE REDUCTION: This exam was performed according to the departmental dose-optimization program which includes automated exposure control, adjustment of the mA and/or kV according to patient size and/or use of iterative reconstruction technique. CONTRAST:  75mL OMNIPAQUE  IOHEXOL  300 MG/ML  SOLN COMPARISON:  None Available. FINDINGS: Pharynx and larynx: Normal. No mass or swelling. Salivary glands: No inflammation, mass, or stone. Thyroid : Multiple low-density thyroid  nodules measuring up to 2.5 cm anteriorly on the right. Lymph nodes: Large (5.8 x 3.7 cm) low density mass in the lower right neck is concerning for an abnormal/enlarged lymph node. Of note, this lymph node abuts and narrows the adjacent internal jugular vein laterally and also abuts the common carotid artery posteriorly. Vascular: Major arteries are patent in the neck. Limited intracranial: Negative. Visualized orbits: Negative. Mastoids and visualized paranasal sinuses: Clear. Skeleton: No evidence of acute fracture. Upper chest: Lung apices are clear. IMPRESSION: 1. Large (5.8 x 3.7 cm) complex low density mass in the lower right neck, posterolateral to the thyroid  is concerning for metastatic adenopathy. 2. Partially imaged intracranial mass at the right petrous apex is suspicious for a  metastasis given the above findings but incompletely imaged/assessed. Recommend MRI of the brain with contrast for further evaluation. 3. Multiple thyroid  nodules measuring up to 2.5 cm. Recommend thyroid  ultrasound for further evaluation (ref: J Am Coll Radiol. 2015 Feb;12(2): 143-50). These results will be called to the ordering clinician or representative by the Radiologist Assistant, and communication documented in the PACS or [employer]. Electronically Signed   By: Gilmore GORMAN Molt M.D.   On: 02/12/2024 21:11    Labs:  CBC: Recent Labs    12/08/23 1322 01/27/24 1338  02/04/24 1513 02/11/24 1353  WBC 4.5 4.6 5.3 5.2  HGB 12.3* 13.2 12.8* 13.3  HCT 36.0* 38.5* 38.5 37.5*  PLT 236 232 246 261    COAGS: No results for input(s): INR, APTT in the last 8760 hours.  BMP: Recent Labs    05/03/23 1141 12/08/23 1322 02/04/24 1513 02/11/24 1353  NA 138 136 145* 138  K 3.8 3.8 4.2 3.9  CL 102 101 104 103  CO2 25 24 23 29   GLUCOSE 91 116* 99 81  BUN 11 11 9 16   CALCIUM 9.5 9.7 9.5 10.2  CREATININE 0.94 0.86 0.86 0.93  GFRNONAA >60 >60  --  >60    LIVER FUNCTION TESTS: Recent Labs    12/08/23 1322 02/04/24 1513 02/11/24 1353  BILITOT 1.0 0.3 0.6  AST 26 20 24   ALT 30 14 27   ALKPHOS 60 70 72  PROT 7.2 7.3 8.4*  ALBUMIN 4.0 4.7 5.0    TUMOR MARKERS: No results for input(s): AFPTM, CEA, CA199, CHROMGRNA in the last 8760 hours.  Assessment and Plan:  Thyroid  Nodule: Philip Marquez is a 33 y.o. male with a history of adrenal neuroblastoma, right sided breast cancer, and previously seen for right neck mass biopsy. He presents to [facility] Interventional Radiology department for an image-guided thyroid  biopsy with Dr. KANDICE Banner. Procedure to be performed under moderate sedation.  Risks and benefits of thyroid  biopsy was discussed with the patient and/or patient's family including, but not limited to bleeding, infection, damage to adjacent structures or low  yield requiring additional tests.  All of the questions were answered and there is agreement to proceed.  Consent signed and in chart.   Thank you for this interesting consult. I greatly enjoyed meeting Philip Marquez and look forward to participating in their care. A copy of this report was sent to the requesting provider on this date.  Electronically Signed: Glennon CHRISTELLA Bal, PA-C 03/03/2024, 10:06 AM   I spent a total of   15 Minutes in face to face clinical consultation, greater than 50% of which was counseling/coordinating care for thyroid  biopsy.

## 2024-03-03 NOTE — Procedures (Signed)
 Pre procedural Dx: thyroid  fna  Post procedural Dx: Same  Technically successful US  guided fine needle aspiration of right thyroid  nodule   EBL: None.   Complications: None immediate.   KANDICE Banner, MD Pager #: [phone]

## 2024-03-04 ENCOUNTER — Other Ambulatory Visit: Payer: Self-pay | Admitting: Physician Assistant

## 2024-03-04 DIAGNOSIS — E041 Nontoxic single thyroid nodule: Secondary | ICD-10-CM

## 2024-03-05 ENCOUNTER — Other Ambulatory Visit: Payer: Self-pay

## 2024-03-05 ENCOUNTER — Telehealth: Payer: Self-pay | Admitting: Physician Assistant

## 2024-03-05 DIAGNOSIS — G939 Disorder of brain, unspecified: Secondary | ICD-10-CM

## 2024-03-05 LAB — CYTOLOGY - NON PAP

## 2024-03-05 NOTE — Telephone Encounter (Signed)
 I notified Philip Marquez by phone regarding [facility] work up results after discussing with Dr. Federico.  - Per previous discussion with Dr. Buckley regarding MRI results, patient will need repeat MRI brain in January 2026 (4 months after original scan 02/15/24). Patient will be scheduled to follow up in clinic with Dr. Buckley to discuss results.  - Per pathology report neck mass biopsy from 02/20/24 is a neurofibroma. Patient will need to establish care with a neurofibroma clinic for follow up. He previously has seen [facility] oncology and is unsure if he plans to re-establish his care there or start with [facility]. Referrals will be faxed to both allowing patient to make the decision.   - Thyroid  biopsy from right mid nodule from 03/03/24 result shows scant follicular cellularity ( Bethesda category 1) rare benign follicular cells. Per US  thyroid  from 02/15/24 patient will need imaging surveillance in 1 year for nodule # 2. Recommend US  also monitor nodule # 1. Will have RN navigator [place] fax recommendations to PCP. Monitoring can be performed by PCP in the future.   No additional oncology work up is needed through [facility]. All of patient's questions were answered and he expressed understanding of the plan provided.

## 2024-03-06 ENCOUNTER — Telehealth: Payer: Self-pay | Admitting: Internal Medicine

## 2024-03-06 NOTE — Telephone Encounter (Signed)
 Scheduled patient for a scan follow-up in January. Called the patient but my call was not able to be completed

## 2024-03-10 ENCOUNTER — Encounter: Payer: Self-pay | Admitting: Medical Oncology

## 2024-03-25 ENCOUNTER — Encounter: Payer: Self-pay | Admitting: Medical Oncology

## 2024-03-25 NOTE — Progress Notes (Signed)
 [facility] for Hand-off Note  03/25/24 12:27 PM  AARONJAMES KELSAY [birth year] [id number]  Cancer Care, Care Team: Norleen IVAR Kidney, MD Mallie Combes, PA-C Colene Raider. RN, Diagnostic Nurse Navigator  Philip Marquez was referred to [facility] on 02/04/2024, and was seen in the [facility], for evaluation of: a mass to the right side of neck.  The patient's diagnostic work-up included: Labs: 02/11/2024 - CBC, C-reactive protein, CMP, Flow Cytometry, Hepatitis B core antibody, Hepatitis B surface antibody qualitative, Hepatitis B surface antigen, Lactate dehydrogenase, sedimentation rate. Imaging: 02/12/24 CT Soft Tissue of Neck, 02/15/24 MRI Brain w wo contrast, 02/15/24 US  Thyroid  (biopsy)  Biopsy: 02/20/2024 US  Core Biopsy lymph nodes, 03/03/2024 US  FNA Bx thyroid  Referrals: [facility] for Neurofibroma, [facility] for Neurofibroma  Patient had thyroid  US  on 02/15/24 with results: 1. Nodule # 1 is a 3.4 cm TI-RADS category 4 nodule in the right mid gland meets criteria to consider fine-needle aspiration biopsy. Biopsy is recommended. 2. Nodule #2 is a smaller 1.3 cm TI-RADS category 4 nodule in the right upper gland and meets criteria for imaging surveillance. Recommend follow-up ultrasound in 1 year.  Patient had thyroid  biopsy of nodule #1 on 03/03/24 with result: SCANT FOLLICULAR CELLULARITY (BETHESDA CATERGORY I). RARE BENIGN FOLLICULAR CELLS.   The recommendation will be for follow up thyroid  US  in 01/2025 with PCP neurofibroma.    We thank you for allowing us  to assist in [facility] care.  The initial and most recent Progress Notes, labs, imaging, procedure(s), and/or consult notes have been routed to you through [facility] or faxed to your office for continuity of care.   Colene KYM Raider, RN, BSN, [facility] Nurse Navigator, [facility] 03/25/2024 12:48 PM

## 2024-04-15 ENCOUNTER — Ambulatory Visit (HOSPITAL_COMMUNITY)
Admission: EM | Admit: 2024-04-15 | Discharge: 2024-04-15 | Disposition: A | Discharge status: Home / Self Care | Payer: Self-pay

## 2024-04-15 ENCOUNTER — Encounter (HOSPITAL_COMMUNITY): Payer: Self-pay | Admitting: Emergency Medicine

## 2024-04-15 ENCOUNTER — Other Ambulatory Visit: Payer: Self-pay

## 2024-04-15 ENCOUNTER — Emergency Department (HOSPITAL_COMMUNITY)
Admission: EM | Admit: 2024-04-15 | Discharge: 2024-04-15 | Disposition: A | Discharge status: Home / Self Care | Payer: Self-pay | Source: Ambulatory Visit | Attending: Emergency Medicine | Admitting: Emergency Medicine

## 2024-04-15 DIAGNOSIS — Z85828 Personal history of other malignant neoplasm of skin: Secondary | ICD-10-CM | POA: Insufficient documentation

## 2024-04-15 DIAGNOSIS — Z85858 Personal history of malignant neoplasm of other endocrine glands: Secondary | ICD-10-CM | POA: Insufficient documentation

## 2024-04-15 DIAGNOSIS — R200 Anesthesia of skin: Secondary | ICD-10-CM

## 2024-04-15 DIAGNOSIS — Z85841 Personal history of malignant neoplasm of brain: Secondary | ICD-10-CM

## 2024-04-15 DIAGNOSIS — Z853 Personal history of malignant neoplasm of breast: Secondary | ICD-10-CM | POA: Insufficient documentation

## 2024-04-15 DIAGNOSIS — G510 Bell's palsy: Secondary | ICD-10-CM | POA: Insufficient documentation

## 2024-04-15 MED ORDER — PREDNISONE 10 MG PO TABS: ORAL_TABLET | ORAL | 0 refills | Status: AC

## 2024-04-15 MED ORDER — ARTIFICIAL TEARS OPHTHALMIC OINT: TOPICAL_OINTMENT | Freq: Every evening | OPHTHALMIC | 0 refills | Status: AC | PRN

## 2024-04-15 NOTE — ED Provider Notes (Signed)
 [facility] [place]  Note:  This document was prepared using Conservation officer, historic buildings and may include unintentional dictation errors.  MRN: [id number] DOB: 1991-03-20  Subjective:   Philip Marquez is a 33 y.o. male presenting for right sided facial numbness with right sided arm numbness since 7 AM this morning.  Patient reports that he did not have any symptoms last night before bed.  Patient states he went to bed around 11 PM and everything was normal.  Patient also admits to recent diagnosis of brain malignancy, has not followed up with neurology yet waiting for appointment on December 17.  Patient denies any altered mental status, loss of consciousness, seizure, severe headache, blurred vision, altered speech, altered coordination.  No current facility-administered medications for this encounter.  Current Outpatient Medications:    artificial tears (LACRILUBE) OINT ophthalmic ointment, Place into both eyes at bedtime as needed for up to 14 days for dry eyes., Disp: 5 g, Rfl: 0   ibuprofen  (ADVIL ) 200 MG tablet, Take 200 mg by mouth every 6 (six) hours as needed., Disp: , Rfl:    Multiple Vitamin (MULTIVITAMIN WITH MINERALS) TABS tablet, Take 1 tablet by mouth daily., Disp: , Rfl:    predniSONE  (DELTASONE ) 10 MG tablet, Take 6 tablets (60 mg total) by mouth daily for 5 days, THEN 5 tablets (50 mg total) daily for 1 day, THEN 4 tablets (40 mg total) daily for 1 day, THEN 3 tablets (30 mg total) daily for 1 day, THEN 2 tablets (20 mg total) daily for 1 day, THEN 1 tablet (10 mg total) daily for 1 day., Disp: 45 tablet, Rfl: 0   Allergies  Allergen Reactions   Bactrim [Sulfamethoxazole-Trimethoprim] Itching    Past Medical History:  Diagnosis Date   Breast cancer (HCC)    Eye inflammation    Right Eye   Neuroblastoma of adrenal gland (HCC)    Panic attacks    Skin cancer    Status post autologous bone marrow transplant (HCC)      Past Surgical History:  Procedure  Laterality Date   ADRENALECTOMY     MASTECTOMY      Family History  Problem Relation Age of Onset   Cancer Maternal Grandfather 78       colorectal   Cancer Other 25       mat great uncle (through [facility]) with possible neuroblastoma   Cancer Other 89       male mat first cousin once removed with breast cancer (related through MGF's sister)    Social History   Tobacco Use   Smoking status: Never   Smokeless tobacco: Never  Vaping Use   Vaping status: Never Used  Substance Use Topics   Alcohol use: Yes    Comment: occ   Drug use: Not Currently    Types: Marijuana    ROS Refer to HPI for ROS details.  Objective:    Vitals: BP 121/88 (BP Location: Left Arm)   Pulse 69   Temp 98.3 F (36.8 C) (Oral)   Resp 15   SpO2 99%   Physical Exam Vitals and nursing note reviewed.  Constitutional:      General: He is not in acute distress.    Appearance: Normal appearance. He is well-developed. He is not ill-appearing or toxic-appearing.  HENT:     Head: Normocephalic.     Nose: Nose normal. No congestion or rhinorrhea.     Mouth/Throat:     Mouth: Mucous membranes are moist.  Pharynx: Oropharynx is clear. No posterior oropharyngeal erythema.  Eyes:     General:        Right eye: No discharge.        Left eye: No discharge.     Extraocular Movements: Extraocular movements intact.     Conjunctiva/sclera: Conjunctivae normal.     Pupils: Pupils are equal, round, and reactive to light.  Cardiovascular:     Rate and Rhythm: Normal rate.  Pulmonary:     Effort: Pulmonary effort is normal. No respiratory distress.  Skin:    General: Skin is warm and dry.  Neurological:     General: No focal deficit present.     Mental Status: He is alert and oriented to person, place, and time.     Cranial Nerves: No cranial nerve deficit.     Sensory: No sensory deficit.     Motor: No weakness.     Coordination: Coordination normal.     Gait: Gait normal.  Psychiatric:         Mood and Affect: Mood normal.        Behavior: Behavior normal.        Thought Content: Thought content normal.        Judgment: Judgment normal.     Procedures  No results found for this or any previous visit (from the past 24 hours).  Assessment and Plan :     Discharge Instructions       1. Hx of malignant neoplasm of brain (Primary) 2. Right facial numbness - Due to new onset of right facial and right arm numbness, with recent diagnosis of malignancy of the brain, recommend immediate follow up in ER for further evaluation and management. - Proper evaluation and treatment will require advanced imaging and stat lab testing, which are unavailable in Urgent care.  Please go to [facility] or [facility] for full evaluation.      Natividad Schlosser B Nicholes Hibler   Alberta Lenhard, [place] B, [place] 04/15/24 1454

## 2024-04-15 NOTE — ED Provider Notes (Signed)
  EMERGENCY DEPARTMENT AT [facility] Provider Note   CSN: [id number] Arrival date & time: 04/15/24  1348     Patient presents with: Facial Droop   Philip Marquez is a 33 y.o. male.   HPI  Patient is a 87 male with past medical history significant for breast cancer, panic attacks, skin cancer, neuroblastoma of the adrenal gland, meningioma and brain  Pt complains of woke up with R facial droop at 7am (asleep at 11pm) has had taste changes for 3-4 days.  He denies any numbness or weakness in arms or legs.  He states he has some tingling in his atrial right side of his face and somewhat decreased light touch sensation.  Denies any fevers or chills but states he was recently experiencing symptoms of a viral infection including cough, congestion.     Prior to Admission medications   Medication Sig Start Date End Date Taking? Authorizing Provider  artificial tears (LACRILUBE) OINT ophthalmic ointment Place into both eyes at bedtime as needed for up to 14 days for dry eyes. 04/15/24 04/29/24 Yes Josten Warmuth S, PA  predniSONE  (DELTASONE ) 10 MG tablet Take 6 tablets (60 mg total) by mouth daily for 5 days, THEN 5 tablets (50 mg total) daily for 1 day, THEN 4 tablets (40 mg total) daily for 1 day, THEN 3 tablets (30 mg total) daily for 1 day, THEN 2 tablets (20 mg total) daily for 1 day, THEN 1 tablet (10 mg total) daily for 1 day. 04/15/24 04/25/24 Yes Demarian Epps S, PA  ibuprofen  (ADVIL ) 200 MG tablet Take 200 mg by mouth every 6 (six) hours as needed.    [provider]  Multiple Vitamin (MULTIVITAMIN WITH MINERALS) TABS tablet Take 1 tablet by mouth daily.    [provider]    Allergies: Bactrim [sulfamethoxazole-trimethoprim]    Review of Systems  Updated Vital Signs BP (!) 128/90   Pulse 74   Temp 98.6 F (37 C)   Resp 18   SpO2 100%   Physical Exam Vitals and nursing note reviewed.  Constitutional:      General: He is not in  acute distress. HENT:     Head: Normocephalic and atraumatic.     Nose: Nose normal.  Eyes:     General: No scleral icterus. Cardiovascular:     Rate and Rhythm: Normal rate and regular rhythm.     Pulses: Normal pulses.     Heart sounds: Normal heart sounds.  Pulmonary:     Effort: Pulmonary effort is normal. No respiratory distress.     Breath sounds: No wheezing.  Abdominal:     Palpations: Abdomen is soft.     Tenderness: There is no abdominal tenderness.  Musculoskeletal:     Cervical back: Normal range of motion.     Right lower leg: No edema.     Left lower leg: No edema.  Skin:    General: Skin is warm and dry.     Capillary Refill: Capillary refill takes less than 2 seconds.  Neurological:     Mental Status: He is alert. Mental status is at baseline.     Comments: Alert and oriented to self, place, time and event.   Speech is fluent, clear without dysarthria or dysphasia.   Strength 5/5 in upper/lower extremities   Sensation intact in upper/lower extremities    CN I not tested  CN II grossly intact visual fields bilaterally. Did not visualize posterior eye.  CN III,  IV, VI PERRLA and EOMs intact bilaterally  CN V right-sided face with decreased sensation compared to left CN VII facial movements asymmetric with right eyebrow raise weak and weakness right corner of mouth with smile and asymmetry with easily openable right eye Lids and difficult to open left eyelids CN VIII not tested  CN IX, X no uvula deviation, symmetric rise of soft palate  CN XI 5/5 SCM and trapezius strength bilaterally  CN XII Midline tongue protrusion, symmetric L/R movements   Psychiatric:        Mood and Affect: Mood normal.        Behavior: Behavior normal.     (all labs ordered are listed, but only abnormal results are displayed) Labs Reviewed - No data to display  EKG: None  Radiology: No results found.   Procedures   Medications Ordered in the ED - No data to display                                   Medical Decision Making Risk Prescription drug management.   Patient is a 42 male with past medical history significant for breast cancer, panic attacks, skin cancer, neuroblastoma of the adrenal gland, meningioma and brain  Pt complains of woke up with R facial droop at 7am (asleep at 11pm) has had taste changes for 3-4 days.  He denies any numbness or weakness in arms or legs.  He states he has some tingling in his atrial right side of his face and somewhat decreased light touch sensation.  Denies any fevers or chills but states he was recently experiencing symptoms of a viral infection including cough, congestion.  I discussed this case with my attending physician who cosigned this note including patient's presenting symptoms, physical exam, and planned diagnostics and interventions. Attending physician stated agreement with plan or made changes to plan which were implemented.   Attending physician assessed patient at bedside.   My plan is to discharge patient with prednisone  60 mg daily for 5 days and then taper by 10 mg daily until done.  Recommend neurology and ophthalmology follow-up Lacri-Lube ointment at bedtime and eyedrops preservative-free during the day.  Follow-up with neurology and have MRI brain done to evaluate and further characterize the meningiomas.  I have a low suspicion that meningioma is causing his symptoms.  Final diagnoses:  Bell's palsy  Facial paralysis on right side    ED Discharge Orders          Ordered    predniSONE  (DELTASONE ) 10 MG tablet  Daily        04/15/24 1437    artificial tears (LACRILUBE) OINT ophthalmic ointment  At bedtime PRN        04/15/24 1437    Ambulatory referral to Neurology       Comments: An appointment is requested in approximately: 2 weeks   04/15/24 1438               Neldon Hamp RAMAN, [place] 04/15/24 1559    Mannie Pac T, DO 04/16/24 1538

## 2024-04-15 NOTE — Discharge Instructions (Signed)
  1. Hx of malignant neoplasm of brain (Primary) 2. Right facial numbness - Due to new onset of right facial and right arm numbness, with recent diagnosis of malignancy of the brain, recommend immediate follow up in ER for further evaluation and management. - Proper evaluation and treatment will require advanced imaging and stat lab testing, which are unavailable in Urgent care.  Please go to [facility] or [facility] for full evaluation.

## 2024-04-15 NOTE — ED Triage Notes (Signed)
 Pt reports woke up around 7am today with right side of face numb. Pt went to bed last night around 11 pm and was normal. Reports that had pain in the back of his neck that has all the time due to tumor on his brain.

## 2024-04-15 NOTE — ED Notes (Signed)
 Patient is being discharged from the Urgent Care and sent to the Emergency Department via POV with family. Per Hosey Aguas, NP, patient is in need of higher level of care due to right side facial numbness and brain tumors. Patient is aware and verbalizes understanding of plan of care.  Vitals:   04/15/24 1328  BP: 121/88  Pulse: 69  Resp: 15  Temp: 98.3 F (36.8 C)  SpO2: 99%

## 2024-04-15 NOTE — ED Triage Notes (Signed)
 Pt woke up this AM with facial numbness and noticed a droop on the right side. NO numbness anywhere else, pt is A&O x 4

## 2024-04-15 NOTE — ED Provider Triage Note (Signed)
 Emergency Medicine Provider Triage Evaluation Note  Philip Marquez , a 33 y.o. male  was evaluated in triage.  Pt complains of woke up with R facial droop at 7am (asleep at 11pm) has had taste changes for 3-4 days.   Review of Systems  Positive: Face tingling and droop Negative: Fever   Physical Exam  BP 125/86   Pulse 75   Temp 98.6 F (37 C)   Resp 18   SpO2 100%  Gen:   Awake, no distress   Resp:  Normal effort  MSK:   Moves extremities without difficulty  Other:  NML neuro exam except full R half of face droop  Medical Decision Making  Medically screening exam initiated at 2:09 PM.  Appropriate orders placed.  Philip Marquez was informed that the remainder of the evaluation will be completed by another provider, this initial triage assessment does not replace that evaluation, and the importance of remaining in the ED until their evaluation is complete.  [address]    Philip Marquez, Philip Marquez 04/15/24 [phone]

## 2024-04-15 NOTE — Discharge Instructions (Addendum)
 You have a condition called Bell's palsy.  Please read the attached information.  Please take prednisone  as prescribed.  This may increase your blood sugars, make you feel agitated, cause insomnia.  However it is shown to improve the likelihood of complete reversal of your facial paralysis.  Please also follow-up with your primary care doctor.   I also recommend (In fact this is likely the most important aspect of care) using artificial tears to keep your left eye moisturized.  You may use these throughout the day.  I recommend using a preservative-free eyedrop. Preservative-free artificial tears: Instill 1-2 drops in affected eye every 2-4 hours while awake for corneal protection. At nighttime please use a eye ointment such as systane nighttime lubricant eye ointment (or other similar product).   It is also important to tape your eyelid closed at night as eye injuries can occur if your eyelids open and your eye dries out.

## 2024-04-20 ENCOUNTER — Encounter: Payer: Self-pay | Admitting: Oncology

## 2024-06-16 ENCOUNTER — Ambulatory Visit (HOSPITAL_COMMUNITY)
Admission: RE | Admit: 2024-06-16 | Discharge: 2024-06-16 | Disposition: A | Discharge status: Home / Self Care | Payer: Self-pay | Source: Ambulatory Visit | Attending: Internal Medicine | Admitting: Internal Medicine

## 2024-06-16 DIAGNOSIS — G939 Disorder of brain, unspecified: Secondary | ICD-10-CM | POA: Insufficient documentation

## 2024-06-16 MED ORDER — GADOBUTROL 1 MMOL/ML IV SOLN
5.0000 mL | Freq: Once | INTRAVENOUS | Status: AC | PRN
  Administered 2024-06-16: 5 mL via INTRAVENOUS

## 2024-06-18 ENCOUNTER — Telehealth: Payer: Self-pay | Admitting: *Deleted

## 2024-06-18 NOTE — Telephone Encounter (Signed)
 PC to patient, no answer, left VM - informed patient his appointment on 06/23/24 needs to be changed due to MD request.  Appointment rescheduled on 06/29/24 at 2:00.  Instructed patient to contact [facility] if this appointment needs to be changed, [phone].

## 2024-06-23 ENCOUNTER — Inpatient Hospital Stay: Payer: Self-pay | Admitting: Internal Medicine

## 2024-06-29 ENCOUNTER — Inpatient Hospital Stay: Payer: Self-pay | Admitting: Internal Medicine

## 2024-07-13 ENCOUNTER — Inpatient Hospital Stay: Payer: Self-pay | Admitting: Internal Medicine

## 2024-08-19 ENCOUNTER — Ambulatory Visit: Payer: Self-pay | Admitting: Neurology
